# Patient Record
Sex: Male | Born: 1950
Health system: Southern US, Community
[De-identification: ages and names within clinical notes are randomized; demographics above are authoritative.]

## PROBLEM LIST (undated history)

## (undated) DIAGNOSIS — E039 Hypothyroidism, unspecified: Secondary | ICD-10-CM

## (undated) DIAGNOSIS — S32009A Unspecified fracture of unspecified lumbar vertebra, initial encounter for closed fracture: Secondary | ICD-10-CM

## (undated) DIAGNOSIS — K219 Gastro-esophageal reflux disease without esophagitis: Secondary | ICD-10-CM

## (undated) HISTORY — DX: Hypothyroidism, unspecified: E03.9

## (undated) HISTORY — DX: Unspecified fracture of unspecified lumbar vertebra, initial encounter for closed fracture: S32.009A

## (undated) HISTORY — PX: ADENOIDECTOMY: SUR15

## (undated) HISTORY — PX: TONSILLECTOMY: SUR1361

---

## 1998-01-10 ENCOUNTER — Inpatient Hospital Stay (HOSPITAL_COMMUNITY): Admission: AD | Admit: 1998-01-10 | Discharge: 1998-01-13 | Payer: Self-pay | Admitting: Neurosurgery

## 1998-01-11 ENCOUNTER — Encounter: Payer: Self-pay | Admitting: Neurosurgery

## 2000-01-25 ENCOUNTER — Ambulatory Visit (HOSPITAL_COMMUNITY): Admission: RE | Admit: 2000-01-25 | Discharge: 2000-01-25 | Payer: Self-pay | Admitting: Orthopedic Surgery

## 2000-01-30 ENCOUNTER — Encounter: Payer: Self-pay | Admitting: Orthopedic Surgery

## 2001-12-31 ENCOUNTER — Encounter: Payer: Self-pay | Admitting: Family Medicine

## 2001-12-31 ENCOUNTER — Ambulatory Visit (HOSPITAL_COMMUNITY): Admission: RE | Admit: 2001-12-31 | Discharge: 2001-12-31 | Payer: Self-pay | Admitting: Family Medicine

## 2002-01-21 ENCOUNTER — Ambulatory Visit (HOSPITAL_COMMUNITY): Admission: RE | Admit: 2002-01-21 | Discharge: 2002-01-21 | Payer: Self-pay | Admitting: General Surgery

## 2002-08-04 ENCOUNTER — Ambulatory Visit (HOSPITAL_COMMUNITY): Admission: RE | Admit: 2002-08-04 | Discharge: 2002-08-04 | Payer: Self-pay | Admitting: Family Medicine

## 2002-08-04 ENCOUNTER — Encounter: Payer: Self-pay | Admitting: Family Medicine

## 2002-08-09 ENCOUNTER — Encounter: Payer: Self-pay | Admitting: Family Medicine

## 2002-08-09 ENCOUNTER — Ambulatory Visit (HOSPITAL_COMMUNITY): Admission: RE | Admit: 2002-08-09 | Discharge: 2002-08-09 | Payer: Self-pay | Admitting: Family Medicine

## 2003-08-18 ENCOUNTER — Ambulatory Visit (HOSPITAL_COMMUNITY): Admission: RE | Admit: 2003-08-18 | Discharge: 2003-08-18 | Payer: Self-pay | Admitting: Family Medicine

## 2004-03-29 ENCOUNTER — Ambulatory Visit (HOSPITAL_COMMUNITY): Admission: RE | Admit: 2004-03-29 | Discharge: 2004-03-29 | Payer: Self-pay | Admitting: Family Medicine

## 2005-12-17 ENCOUNTER — Ambulatory Visit (HOSPITAL_COMMUNITY): Admission: RE | Admit: 2005-12-17 | Discharge: 2005-12-17 | Payer: Self-pay | Admitting: Family Medicine

## 2007-10-15 ENCOUNTER — Emergency Department (HOSPITAL_COMMUNITY): Admission: EM | Admit: 2007-10-15 | Discharge: 2007-10-15 | Payer: Self-pay | Admitting: Emergency Medicine

## 2008-02-25 ENCOUNTER — Emergency Department (HOSPITAL_COMMUNITY): Admission: EM | Admit: 2008-02-25 | Discharge: 2008-02-25 | Payer: Self-pay | Admitting: Emergency Medicine

## 2008-03-22 ENCOUNTER — Ambulatory Visit (HOSPITAL_COMMUNITY): Admission: RE | Admit: 2008-03-22 | Discharge: 2008-03-22 | Payer: Self-pay | Admitting: Family Medicine

## 2008-05-19 ENCOUNTER — Ambulatory Visit (HOSPITAL_COMMUNITY): Admission: RE | Admit: 2008-05-19 | Discharge: 2008-05-19 | Payer: Self-pay | Admitting: Family Medicine

## 2009-12-26 ENCOUNTER — Ambulatory Visit (HOSPITAL_COMMUNITY): Admission: RE | Admit: 2009-12-26 | Discharge: 2009-12-26 | Payer: Self-pay | Admitting: General Surgery

## 2010-05-29 ENCOUNTER — Other Ambulatory Visit (HOSPITAL_COMMUNITY): Payer: Self-pay | Admitting: Family Medicine

## 2010-05-29 ENCOUNTER — Ambulatory Visit (HOSPITAL_COMMUNITY)
Admission: RE | Admit: 2010-05-29 | Discharge: 2010-05-29 | Disposition: A | Discharge status: Home / Self Care | Payer: PRIVATE HEALTH INSURANCE | Source: Ambulatory Visit | Attending: Family Medicine | Admitting: Family Medicine

## 2010-05-29 DIAGNOSIS — M545 Low back pain, unspecified: Secondary | ICD-10-CM | POA: Insufficient documentation

## 2010-05-29 DIAGNOSIS — R52 Pain, unspecified: Secondary | ICD-10-CM

## 2010-05-29 DIAGNOSIS — W19XXXA Unspecified fall, initial encounter: Secondary | ICD-10-CM

## 2010-05-29 DIAGNOSIS — M533 Sacrococcygeal disorders, not elsewhere classified: Secondary | ICD-10-CM | POA: Insufficient documentation

## 2010-05-31 ENCOUNTER — Other Ambulatory Visit (HOSPITAL_COMMUNITY): Payer: Self-pay | Admitting: Family Medicine

## 2010-05-31 DIAGNOSIS — N5082 Scrotal pain: Secondary | ICD-10-CM

## 2010-06-04 ENCOUNTER — Other Ambulatory Visit (HOSPITAL_COMMUNITY): Payer: Self-pay | Admitting: Family Medicine

## 2010-06-04 ENCOUNTER — Ambulatory Visit (HOSPITAL_COMMUNITY)
Admission: RE | Admit: 2010-06-04 | Discharge: 2010-06-04 | Disposition: A | Discharge status: Home / Self Care | Payer: PRIVATE HEALTH INSURANCE | Source: Ambulatory Visit | Attending: Family Medicine | Admitting: Family Medicine

## 2010-06-04 DIAGNOSIS — N5082 Scrotal pain: Secondary | ICD-10-CM

## 2010-06-04 DIAGNOSIS — N509 Disorder of male genital organs, unspecified: Secondary | ICD-10-CM | POA: Insufficient documentation

## 2010-06-22 ENCOUNTER — Ambulatory Visit (INDEPENDENT_AMBULATORY_CARE_PROVIDER_SITE_OTHER): Payer: PRIVATE HEALTH INSURANCE | Admitting: Urology

## 2010-06-22 DIAGNOSIS — N509 Disorder of male genital organs, unspecified: Secondary | ICD-10-CM

## 2010-06-22 DIAGNOSIS — N400 Enlarged prostate without lower urinary tract symptoms: Secondary | ICD-10-CM

## 2010-07-27 ENCOUNTER — Ambulatory Visit (INDEPENDENT_AMBULATORY_CARE_PROVIDER_SITE_OTHER): Payer: PRIVATE HEALTH INSURANCE | Admitting: Urology

## 2010-07-27 DIAGNOSIS — R300 Dysuria: Secondary | ICD-10-CM

## 2010-07-27 DIAGNOSIS — N509 Disorder of male genital organs, unspecified: Secondary | ICD-10-CM

## 2010-07-27 DIAGNOSIS — R972 Elevated prostate specific antigen [PSA]: Secondary | ICD-10-CM

## 2010-10-19 ENCOUNTER — Other Ambulatory Visit (HOSPITAL_COMMUNITY): Payer: Self-pay | Admitting: Family Medicine

## 2010-10-19 ENCOUNTER — Ambulatory Visit (HOSPITAL_COMMUNITY)
Admission: RE | Admit: 2010-10-19 | Discharge: 2010-10-19 | Disposition: A | Discharge status: Home / Self Care | Payer: PRIVATE HEALTH INSURANCE | Source: Ambulatory Visit | Attending: Family Medicine | Admitting: Family Medicine

## 2010-10-19 DIAGNOSIS — R937 Abnormal findings on diagnostic imaging of other parts of musculoskeletal system: Secondary | ICD-10-CM | POA: Insufficient documentation

## 2010-10-19 DIAGNOSIS — M25579 Pain in unspecified ankle and joints of unspecified foot: Secondary | ICD-10-CM

## 2010-10-19 DIAGNOSIS — S8990XA Unspecified injury of unspecified lower leg, initial encounter: Secondary | ICD-10-CM | POA: Insufficient documentation

## 2010-10-19 DIAGNOSIS — M25473 Effusion, unspecified ankle: Secondary | ICD-10-CM | POA: Insufficient documentation

## 2010-10-19 DIAGNOSIS — S99929A Unspecified injury of unspecified foot, initial encounter: Secondary | ICD-10-CM | POA: Insufficient documentation

## 2010-10-19 DIAGNOSIS — M25476 Effusion, unspecified foot: Secondary | ICD-10-CM | POA: Insufficient documentation

## 2010-10-19 DIAGNOSIS — W11XXXA Fall on and from ladder, initial encounter: Secondary | ICD-10-CM | POA: Insufficient documentation

## 2010-12-28 LAB — POCT I-STAT, CHEM 8
Calcium, Ion: 1 mmol/L — ABNORMAL LOW (ref 1.12–1.32)
Creatinine, Ser: 1.3 mg/dL (ref 0.4–1.5)
Glucose, Bld: 119 mg/dL — ABNORMAL HIGH (ref 70–99)
HCT: 43 % (ref 39.0–52.0)
Hemoglobin: 14.6 g/dL (ref 13.0–17.0)

## 2012-09-15 NOTE — H&P (Signed)
  NTS SOAP Note  Vital Signs:  Vitals as of: 09/15/2012: Systolic 139: Diastolic 78: Heart Rate 69: Temp 97.61F: Height 52ft 0in: Weight 208Lbs 0 Ounces: BMI 28.21  BMI : 28.21 kg/m2  Subjective: This 1 Years 66 Months old Male presents for follow up TCS due to polyp removed in 2011 and recent worsening dysphagia.  Does have a h/o hiatal hernia.  Has been heving worsening reflux and dysphagia of solid food over the past six weeks.  No lower gi complaints.  Had to make himself throw up recently.   Review of Symptoms:  Constitutional:unremarkable   Head:unremarkable    Eyes:unremarkable   Nose/Mouth/Throat:unremarkable Cardiovascular:  unremarkable   Respiratory:unremarkable       as above Genitourinary:unremarkable     Skin:unremarkable Hematolgic/Lymphatic:unremarkable     Allergic/Immunologic:unremarkable     Past Medical History:    Reviewed   Past Medical History  Surgical History: TCS/EGD in 2011 Medical Problems: reflux Allergies: codeine Medications: prilosec   Social History:Reviewed  Social History  Preferred Language: English Race:  White Ethnicity: Not Hispanic / Latino Age: 62 Years 3 Months Marital Status:  M Alcohol: yes Recreational drug(s):  No   Smoking Status: Current every day smoker reviewed on 09/15/2012 Started Date: 03/25/1968 Packs per day: 1.00 Functional Status reviewed on mm/dd/yyyy ------------------------------------------------ Bathing: Normal Cooking: Normal Dressing: Normal Driving: Normal Eating: Normal Managing Meds: Normal Oral Care: Normal Shopping: Normal Toileting: Normal Transferring: Normal Walking: Normal Cognitive Status reviewed on mm/dd/yyyy ------------------------------------------------ Attention: Normal Decision Making: Normal Language: Normal Memory: Normal Motor: Normal Perception: Normal Problem Solving: Normal Visual and Spatial: Normal   Family History:   Reviewed  Family Health History Family History is Unknown    Objective Information: General:  Well appearing, well nourished in no distress. Neck:  Supple without lymphadenopathy.  Heart:  RRR, no murmur Lungs:    CTA bilaterally, no wheezes, rhonchi, rales.  Breathing unlabored. Abdomen:Soft, NT/ND, no HSM, no masses.   deferred to procedure  Assessment:h/o colon polyp, dysphagia, hiatal hernia  Diagnosis &amp; Procedure Smart Code   Plan:Scheduled for TCS, EGD, possible dilatation on 09/22/12.   Patient Education:Alternative treatments to surgery were discussed with patient (and family).  Risks and benefits  of procedure including bleeding and perforation were fully explained to the patient (and family) who gave informed consent. Patient/family questions were addressed.  Follow-up:Pending Surgery

## 2012-09-16 ENCOUNTER — Encounter (HOSPITAL_COMMUNITY): Payer: Self-pay | Admitting: Pharmacy Technician

## 2012-09-22 ENCOUNTER — Encounter (HOSPITAL_COMMUNITY): Payer: Self-pay | Admitting: *Deleted

## 2012-09-22 ENCOUNTER — Ambulatory Visit (HOSPITAL_COMMUNITY)
Admission: RE | Admit: 2012-09-22 | Discharge: 2012-09-22 | Disposition: A | Discharge status: Home / Self Care | Payer: PRIVATE HEALTH INSURANCE | Source: Ambulatory Visit | Attending: General Surgery | Admitting: General Surgery

## 2012-09-22 ENCOUNTER — Encounter (HOSPITAL_COMMUNITY)
Admission: RE | Disposition: A | Discharge status: Home / Self Care | Payer: Self-pay | Source: Ambulatory Visit | Attending: General Surgery

## 2012-09-22 DIAGNOSIS — K449 Diaphragmatic hernia without obstruction or gangrene: Secondary | ICD-10-CM | POA: Insufficient documentation

## 2012-09-22 DIAGNOSIS — Z79899 Other long term (current) drug therapy: Secondary | ICD-10-CM | POA: Insufficient documentation

## 2012-09-22 DIAGNOSIS — Q393 Congenital stenosis and stricture of esophagus: Secondary | ICD-10-CM | POA: Insufficient documentation

## 2012-09-22 DIAGNOSIS — F17200 Nicotine dependence, unspecified, uncomplicated: Secondary | ICD-10-CM | POA: Insufficient documentation

## 2012-09-22 DIAGNOSIS — Z1211 Encounter for screening for malignant neoplasm of colon: Secondary | ICD-10-CM | POA: Insufficient documentation

## 2012-09-22 DIAGNOSIS — K219 Gastro-esophageal reflux disease without esophagitis: Secondary | ICD-10-CM | POA: Insufficient documentation

## 2012-09-22 DIAGNOSIS — Z860100 Personal history of colon polyps, unspecified: Secondary | ICD-10-CM | POA: Insufficient documentation

## 2012-09-22 DIAGNOSIS — Z885 Allergy status to narcotic agent status: Secondary | ICD-10-CM | POA: Insufficient documentation

## 2012-09-22 DIAGNOSIS — K319 Disease of stomach and duodenum, unspecified: Secondary | ICD-10-CM | POA: Insufficient documentation

## 2012-09-22 DIAGNOSIS — Q391 Atresia of esophagus with tracheo-esophageal fistula: Secondary | ICD-10-CM | POA: Insufficient documentation

## 2012-09-22 HISTORY — DX: Gastro-esophageal reflux disease without esophagitis: K21.9

## 2012-09-22 HISTORY — PX: COLONOSCOPY: SHX5424

## 2012-09-22 HISTORY — PX: ESOPHAGOGASTRODUODENOSCOPY (EGD) WITH ESOPHAGEAL DILATION: SHX5812

## 2012-09-22 SURGERY — COLONOSCOPY
Anesthesia: Moderate Sedation

## 2012-09-22 MED ORDER — MEPERIDINE HCL 25 MG/ML IJ SOLN
INTRAMUSCULAR | Status: DC | PRN
  Administered 2012-09-22: 50 mg via INTRAVENOUS

## 2012-09-22 MED ORDER — MIDAZOLAM HCL 5 MG/5ML IJ SOLN
INTRAMUSCULAR | Status: DC | PRN
  Administered 2012-09-22: 1 mg via INTRAVENOUS
  Administered 2012-09-22: 4 mg via INTRAVENOUS

## 2012-09-22 MED ORDER — SODIUM CHLORIDE 0.9 % IV SOLN
INTRAVENOUS | Status: DC
  Administered 2012-09-22: 09:00:00 via INTRAVENOUS

## 2012-09-22 MED ORDER — BUTAMBEN-TETRACAINE-BENZOCAINE 2-2-14 % EX AERO
INHALATION_SPRAY | CUTANEOUS | Status: DC | PRN
  Administered 2012-09-22: 2 via TOPICAL

## 2012-09-22 MED ORDER — OMEPRAZOLE 40 MG PO CPDR: 40.0000 mg | DELAYED_RELEASE_CAPSULE | Freq: Every day | ORAL | Status: DC

## 2012-09-22 MED ORDER — MEPERIDINE HCL 100 MG/ML IJ SOLN
INTRAMUSCULAR | Status: AC
  Filled 2012-09-22: qty 1

## 2012-09-22 MED ORDER — MIDAZOLAM HCL 5 MG/5ML IJ SOLN
INTRAMUSCULAR | Status: AC
  Filled 2012-09-22: qty 10

## 2012-09-22 NOTE — Op Note (Signed)
[facility] [address] [place], [address]   ENDOSCOPY PROCEDURE REPORT  PATIENT: Lucas Leach, Lucas Leach  MR#: [id number] BIRTHDATE: 05-18-1950 , 62  yrs. old GENDER: Male ENDOSCOPIST: Franky Macho, MD REFERRED BY:  Assunta Found PROCEDURE DATE:  09/22/2012 PROCEDURE:  EGD w/ biopsy for H.pylori and Maloney dilation of esophagus ASA CLASS:     Class II INDICATIONS:  Dysphagia. MEDICATIONS: Versed 5 mg IV and Demerol 50 mg IV TOPICAL ANESTHETIC: Cetacaine Spray  DESCRIPTION OF PROCEDURE: After the risks benefits and alternatives of the procedure were thoroughly explained, informed consent was obtained.  The EC-3890Li ([id number]) and [id number] ([id number]) endoscope was introduced through the mouth and advanced to the second portion of the duodenum. Without limitations.  The instrument was slowly withdrawn as the mucosa was fully examined.        DUODENUM: The duodenal mucosa showed no abnormalities in the entire duodenum.  STOMACH: There was mild antral gastropathy noted.  Cold forcep biopsies were taken at the antrum.   A medium sized hiatal hernia was noted.  ESOPHAGUS: A mildly severe Schatzki ring was found at the gastroesophageal junction.  Maloney dilatation was done gradually starting with 52, then 54, 56, ending with 58.  No blood noted at end of dilatation.  Inspected with endoscopy and no evidence of perforation noted.  Retroflexed views revealed a hiatal hernia.     The scope was then withdrawn from the patient and the procedure completed.  COMPLICATIONS: There were no complications. ENDOSCOPIC IMPRESSION: 1.   The duodenal mucosa showed no abnormalities in the entire duodenum 2.   There was mild antral gastropathy noted [T2] 3.   Medium sized hiatal hernia 4.   Schatzki ring was found at the gastroesophageal junction  RECOMMENDATIONS: 1.  Continue PPI 2.  Dilatations PRN  REPEAT EXAM:  eSigned:  Franky Macho, MD 09/22/2012 10:07  AM   CC:

## 2012-09-22 NOTE — Interval H&P Note (Signed)
History and Physical Interval Note:  09/22/2012 9:24 AM  Lucas Leach  has presented today for surgery, with the diagnosis of colon polyp, dysphagia  The various methods of treatment have been discussed with the patient and family. After consideration of risks, benefits and other options for treatment, the patient has consented to  Procedure(s): COLONOSCOPY (N/A) ESOPHAGOGASTRODUODENOSCOPY (EGD) WITH POSSIBLE ESOPHAGEAL DILATION (N/A) as a surgical intervention .  The patient's history has been reviewed, patient examined, no change in status, stable for surgery.  I have reviewed the patient's chart and labs.  Questions were answered to the patient's satisfaction.     Franky Macho A

## 2012-09-22 NOTE — Op Note (Signed)
[facility] [address] [place], [address]   COLONOSCOPY PROCEDURE REPORT  PATIENT: Lucas Leach, Lucas Leach  MR#: [id number] BIRTHDATE: Sep 17, 1950 , 62  yrs. old GENDER: Male ENDOSCOPIST: Franky Macho, MD REFERRED XB:[id number], John PROCEDURE DATE:  09/22/2012 PROCEDURE:   Colonoscopy, surveillance ASA CLASS:   Class II INDICATIONS:Patient's personal history of adenomatous colon polyps.  MEDICATIONS: Versed 5 mg IV and Demerol 50 mg IV  DESCRIPTION OF PROCEDURE:   After the risks benefits and alternatives of the procedure were thoroughly explained, informed consent was obtained.  A digital rectal exam revealed no abnormalities of the rectum.   The EC-3890Li ([id number])  endoscope was introduced through the anus and advanced to the cecum, which was identified by both the appendix and ileocecal valve. No adverse events experienced.   The quality of the prep was adequate, using MoviPrep  The instrument was then slowly withdrawn as the colon was fully examined.      COLON FINDINGS: A normal appearing cecum, ileocecal valve, and appendiceal orifice were identified.  The ascending, hepatic flexure, transverse, splenic flexure, descending, sigmoid colon and rectum appeared unremarkable.  No polyps or cancers were seen. Retroflexed views revealed no abnormalities. The time to cecum=3 minutes 0 seconds.  Withdrawal time=4 minutes 0 seconds.  The scope was withdrawn and the procedure completed. COMPLICATIONS: There were no complications.  ENDOSCOPIC IMPRESSION: Normal colon  RECOMMENDATIONS: Repeat Colonscopy in 10 years.   eSigned:  Franky Macho, MD 09/22/2012 9:59 AM   cc:

## 2012-09-24 ENCOUNTER — Encounter (HOSPITAL_COMMUNITY): Payer: Self-pay | Admitting: General Surgery

## 2014-06-09 ENCOUNTER — Other Ambulatory Visit (HOSPITAL_COMMUNITY): Payer: Self-pay | Admitting: Family Medicine

## 2014-06-09 DIAGNOSIS — Z139 Encounter for screening, unspecified: Secondary | ICD-10-CM

## 2014-06-14 ENCOUNTER — Ambulatory Visit (HOSPITAL_COMMUNITY): Payer: PRIVATE HEALTH INSURANCE

## 2014-06-17 ENCOUNTER — Ambulatory Visit (HOSPITAL_COMMUNITY): Payer: PRIVATE HEALTH INSURANCE

## 2015-04-07 DIAGNOSIS — Z23 Encounter for immunization: Secondary | ICD-10-CM | POA: Diagnosis not present

## 2015-04-07 DIAGNOSIS — J069 Acute upper respiratory infection, unspecified: Secondary | ICD-10-CM | POA: Diagnosis not present

## 2015-04-07 DIAGNOSIS — Z1389 Encounter for screening for other disorder: Secondary | ICD-10-CM | POA: Diagnosis not present

## 2015-04-07 DIAGNOSIS — Z6830 Body mass index (BMI) 30.0-30.9, adult: Secondary | ICD-10-CM | POA: Diagnosis not present

## 2015-06-21 DIAGNOSIS — R195 Other fecal abnormalities: Secondary | ICD-10-CM | POA: Diagnosis not present

## 2015-06-21 DIAGNOSIS — Z6829 Body mass index (BMI) 29.0-29.9, adult: Secondary | ICD-10-CM | POA: Diagnosis not present

## 2015-06-21 DIAGNOSIS — R7309 Other abnormal glucose: Secondary | ICD-10-CM | POA: Diagnosis not present

## 2015-06-21 DIAGNOSIS — Z1389 Encounter for screening for other disorder: Secondary | ICD-10-CM | POA: Diagnosis not present

## 2015-06-21 DIAGNOSIS — R1013 Epigastric pain: Secondary | ICD-10-CM | POA: Diagnosis not present

## 2015-06-21 DIAGNOSIS — E663 Overweight: Secondary | ICD-10-CM | POA: Diagnosis not present

## 2015-07-10 DIAGNOSIS — K760 Fatty (change of) liver, not elsewhere classified: Secondary | ICD-10-CM | POA: Diagnosis not present

## 2015-07-10 DIAGNOSIS — N400 Enlarged prostate without lower urinary tract symptoms: Secondary | ICD-10-CM | POA: Diagnosis not present

## 2015-07-12 DIAGNOSIS — N400 Enlarged prostate without lower urinary tract symptoms: Secondary | ICD-10-CM | POA: Diagnosis not present

## 2015-09-05 DIAGNOSIS — K449 Diaphragmatic hernia without obstruction or gangrene: Secondary | ICD-10-CM | POA: Diagnosis not present

## 2015-09-20 DIAGNOSIS — Z0000 Encounter for general adult medical examination without abnormal findings: Secondary | ICD-10-CM | POA: Diagnosis not present

## 2015-09-20 DIAGNOSIS — Z6829 Body mass index (BMI) 29.0-29.9, adult: Secondary | ICD-10-CM | POA: Diagnosis not present

## 2015-09-20 DIAGNOSIS — Z1389 Encounter for screening for other disorder: Secondary | ICD-10-CM | POA: Diagnosis not present

## 2015-09-20 DIAGNOSIS — E782 Mixed hyperlipidemia: Secondary | ICD-10-CM | POA: Diagnosis not present

## 2015-09-20 DIAGNOSIS — R7309 Other abnormal glucose: Secondary | ICD-10-CM | POA: Diagnosis not present

## 2015-11-22 ENCOUNTER — Ambulatory Visit (INDEPENDENT_AMBULATORY_CARE_PROVIDER_SITE_OTHER): Payer: PPO | Admitting: Urology

## 2015-11-22 DIAGNOSIS — R972 Elevated prostate specific antigen [PSA]: Secondary | ICD-10-CM | POA: Diagnosis not present

## 2015-11-22 DIAGNOSIS — R351 Nocturia: Secondary | ICD-10-CM

## 2015-11-22 DIAGNOSIS — N401 Enlarged prostate with lower urinary tract symptoms: Secondary | ICD-10-CM

## 2016-02-19 DIAGNOSIS — R972 Elevated prostate specific antigen [PSA]: Secondary | ICD-10-CM | POA: Diagnosis not present

## 2016-02-19 DIAGNOSIS — Z23 Encounter for immunization: Secondary | ICD-10-CM | POA: Diagnosis not present

## 2016-02-28 ENCOUNTER — Ambulatory Visit (INDEPENDENT_AMBULATORY_CARE_PROVIDER_SITE_OTHER): Payer: PPO | Admitting: Urology

## 2016-02-28 DIAGNOSIS — N401 Enlarged prostate with lower urinary tract symptoms: Secondary | ICD-10-CM

## 2016-02-28 DIAGNOSIS — R972 Elevated prostate specific antigen [PSA]: Secondary | ICD-10-CM | POA: Diagnosis not present

## 2016-03-25 HISTORY — PX: THUMB AMPUTATION: SHX804

## 2016-07-10 ENCOUNTER — Ambulatory Visit (HOSPITAL_COMMUNITY)
Admission: RE | Admit: 2016-07-10 | Discharge: 2016-07-10 | Disposition: A | Discharge status: Home / Self Care | Payer: PPO | Source: Ambulatory Visit | Attending: Family Medicine | Admitting: Family Medicine

## 2016-07-10 ENCOUNTER — Other Ambulatory Visit (HOSPITAL_COMMUNITY): Payer: Self-pay | Admitting: Family Medicine

## 2016-07-10 DIAGNOSIS — Z6829 Body mass index (BMI) 29.0-29.9, adult: Secondary | ICD-10-CM | POA: Diagnosis not present

## 2016-07-10 DIAGNOSIS — R0602 Shortness of breath: Secondary | ICD-10-CM | POA: Diagnosis not present

## 2016-07-10 DIAGNOSIS — R002 Palpitations: Secondary | ICD-10-CM | POA: Diagnosis not present

## 2016-07-10 DIAGNOSIS — Z1389 Encounter for screening for other disorder: Secondary | ICD-10-CM | POA: Diagnosis not present

## 2016-07-10 DIAGNOSIS — R0789 Other chest pain: Secondary | ICD-10-CM | POA: Diagnosis not present

## 2016-07-10 DIAGNOSIS — E782 Mixed hyperlipidemia: Secondary | ICD-10-CM | POA: Diagnosis not present

## 2016-07-10 DIAGNOSIS — R7309 Other abnormal glucose: Secondary | ICD-10-CM | POA: Diagnosis not present

## 2016-07-31 ENCOUNTER — Encounter: Payer: Self-pay | Admitting: Cardiology

## 2016-07-31 NOTE — Progress Notes (Signed)
Cardiology Office Note  Date: 08/01/2016   ID: Gardiner CoinsCalvin M Selvage, DOB 12/09/1950, MRN [id number]  PCP: Assunta FoundGolding, John, MD  Consulting Cardiologist: Nona DellSamuel Chrystel Barefield, MD   Chief Complaint  Patient presents with  . Chest discomfort  . Dyspnea on exertion    History of Present Illness: Lucas Leach is a 66 y.o. male referred for cardiology consultation by Dr. Phillips OdorGolding for the assessment of shortness of breath and chest pain. Patient works as a Music therapistcarpenter, states that over the last 6 months or so he has felt more short of breath with activities such as carrying lumber, bending over to pick something up, walking up steps. He does not report chest tightness with activity, but at nighttime when he is still, he occasionally feels pressure in his chest and a sense of forceful heartbeat. This does not occur with any regularity. He does not report any dizziness or syncope.  He tells me that he underwent cardiac testing including a stress test approximately 8-10 years ago, does not recall any abnormalities. Cardiac risk factors include gender and age, prior history of tobacco use. I personally reviewed his ECG today which shows sinus rhythm with leftward axis, nonspecific T-wave changes, and decreased R wave progression. I am not certain about his lipid status, he follows with Dr. Phillips OdorGolding, no recent labwork available for review. Patient tells me that his mother died recently in her 380s with heart failure, uncertain about any definite premature CAD in the family.  Past Medical History:  Diagnosis Date  . GERD (gastroesophageal reflux disease)   . Hypothyroidism   . Lumbar vertebral fracture (HCC)    L3    Past Surgical History:  Procedure Laterality Date  . ADENOIDECTOMY    . COLONOSCOPY N/A 09/22/2012   Procedure: COLONOSCOPY;  Surgeon: Dalia HeadingMark A Jenkins, MD;  [facility];  Service: Gastroenterology;  Laterality: N/A;  . ESOPHAGOGASTRODUODENOSCOPY (EGD) WITH ESOPHAGEAL DILATION N/A 09/22/2012    Procedure: ESOPHAGOGASTRODUODENOSCOPY (EGD) WITH POSSIBLE ESOPHAGEAL DILATION;  Surgeon: Dalia HeadingMark A Jenkins, MD;  [facility]: [facility];  Service: Gastroenterology;  Laterality: N/A;  . TONSILLECTOMY      Current Outpatient Prescriptions  Medication Sig Dispense Refill  . aspirin EC 81 MG tablet Take 81 mg by mouth daily.    Marland Kitchen. levothyroxine (SYNTHROID, LEVOTHROID) 50 MCG tablet Take 50 mcg by mouth daily before breakfast.     . Multiple Vitamin (MULTIVITAMIN WITH MINERALS) TABS Take 1 tablet by mouth every other day.    Marland Kitchen. omeprazole (PRILOSEC) 40 MG capsule Take 1 capsule (40 mg total) by mouth daily. 90 capsule 3   No current facility-administered medications for this visit.    Allergies:  Codeine and Latex   Social History: The patient  reports that he has quit smoking. His smoking use included Cigarettes. He has never used smokeless tobacco. He reports that he drinks alcohol. He reports that he does not use drugs.   Family History: The patient's family history includes Cerebral aneurysm in his father; Diabetes Mellitus II in his mother.   ROS:  Please see the history of present illness. Otherwise, complete review of systems is positive for lower back pain.  All other systems are reviewed and negative.   Physical Exam: VS:  BP 138/68 (BP Location: Right Arm)   Pulse 63   Ht 6' (1.829 m)   Wt 211 lb (95.7 kg)   SpO2 96%   BMI 28.62 kg/m , BMI Body mass index is 28.62 kg/m.  Wt Readings  from Last 3 Encounters:  08/01/16 211 lb (95.7 kg)  09/22/12 208 lb (94.3 kg)    General: Overweight male, appears comfortable at rest. HEENT: Conjunctiva and lids normal, oropharynx clear. Neck: Supple, no elevated JVP or carotid bruits, no thyromegaly. Lungs: Clear to auscultation, no wheezing, nonlabored breathing at rest. Cardiac: Regular rate and rhythm, no S3 or significant systolic murmur, no pericardial rub. Abdomen: Soft, nontender, bowel sounds present, no guarding or  rebound. Extremities: No pitting edema, distal pulses 2+. Skin: Warm and dry. Musculoskeletal: No kyphosis. Neuropsychiatric: Alert and oriented x3, affect grossly appropriate.  ECG: No old tracing available for comparison.  Assessment and Plan:  1. Progressive dyspnea on exertion with intermittent chest tightness as outlined above. Patient has noticed this over the last 6 months. Cardiac risk factors include gender and age, previous tobacco use. I am not certain about his lipid status. His elderly mother died with heart failure, not clear that there is any premature CAD in his family. He has not undergone any recent ischemic testing, baseline ECG is abnormal but relatively nonspecific. States that he would have significant difficulty walking on the treadmill, does have chronic back pain. We will obtain a Lexiscan Myoview for ischemic evaluation with echocardiogram to assess cardiac structure and function.  2. Hypothyroidism, on Synthroid.  3. Tobacco abuse in remission.  4. GERD, on Prilosec.  Current medicines were reviewed with the patient today.   Orders Placed This Encounter  Procedures  . NM Myocar Multi W/Spect W/Wall Motion / EF  . Myocardial Perfusion Imaging  . EKG 12-Lead  . ECHOCARDIOGRAM COMPLETE    Disposition: Call with test results.  Signed, Jonelle Sidle, MD, [facility] 08/01/2016 10:43 AM    [facility] [address] [place], [place] [address] Phone: [phone]; Fax: [phone]

## 2016-08-01 ENCOUNTER — Ambulatory Visit (INDEPENDENT_AMBULATORY_CARE_PROVIDER_SITE_OTHER): Payer: PPO | Admitting: Cardiology

## 2016-08-01 ENCOUNTER — Encounter: Payer: Self-pay | Admitting: Cardiology

## 2016-08-01 VITALS — BP 138/68 | HR 63 | Ht 72.0 in | Wt 211.0 lb

## 2016-08-01 DIAGNOSIS — K219 Gastro-esophageal reflux disease without esophagitis: Secondary | ICD-10-CM | POA: Diagnosis not present

## 2016-08-01 DIAGNOSIS — R072 Precordial pain: Secondary | ICD-10-CM

## 2016-08-01 DIAGNOSIS — R0609 Other forms of dyspnea: Secondary | ICD-10-CM | POA: Diagnosis not present

## 2016-08-01 DIAGNOSIS — E039 Hypothyroidism, unspecified: Secondary | ICD-10-CM

## 2016-08-01 DIAGNOSIS — R9431 Abnormal electrocardiogram [ECG] [EKG]: Secondary | ICD-10-CM

## 2016-08-01 DIAGNOSIS — F17201 Nicotine dependence, unspecified, in remission: Secondary | ICD-10-CM

## 2016-08-01 NOTE — Patient Instructions (Signed)
Medication Instructions:  Your physician recommends that you continue on your current medications as directed. Please refer to the Current Medication list given to you today.  Labwork: NONE  Testing/Procedures: Your physician has requested that you have an echocardiogram. Echocardiography is a painless test that uses sound waves to create images of your heart. It provides your doctor with information about the size and shape of your heart and how well your heart's chambers and valves are working. This procedure takes approximately one hour. There are no restrictions for this procedure.  Your physician has requested that you have a lexiscan myoview. For further information please visit [email or web address]. Please follow instruction sheet, as given.  Follow-Up: Your physician recommends that you schedule a follow-up appointment PENDING TEST RESULTS  Any Other Special Instructions Will Be Listed Below (If Applicable).  If you need a refill on your cardiac medications before your next appointment, please call your pharmacy. 

## 2016-08-08 ENCOUNTER — Ambulatory Visit (HOSPITAL_BASED_OUTPATIENT_CLINIC_OR_DEPARTMENT_OTHER)
Admission: RE | Admit: 2016-08-08 | Discharge: 2016-08-08 | Disposition: A | Discharge status: Home / Self Care | Payer: PPO | Source: Ambulatory Visit | Attending: Cardiology | Admitting: Cardiology

## 2016-08-08 ENCOUNTER — Encounter (HOSPITAL_COMMUNITY)
Admission: RE | Admit: 2016-08-08 | Discharge: 2016-08-08 | Disposition: A | Discharge status: Home / Self Care | Payer: PPO | Source: Ambulatory Visit | Attending: Cardiology | Admitting: Cardiology

## 2016-08-08 ENCOUNTER — Encounter (HOSPITAL_BASED_OUTPATIENT_CLINIC_OR_DEPARTMENT_OTHER)
Admission: RE | Admit: 2016-08-08 | Discharge: 2016-08-08 | Disposition: A | Discharge status: Home / Self Care | Payer: PPO | Source: Ambulatory Visit | Attending: Cardiology | Admitting: Cardiology

## 2016-08-08 ENCOUNTER — Encounter (HOSPITAL_COMMUNITY): Payer: Self-pay

## 2016-08-08 DIAGNOSIS — R0609 Other forms of dyspnea: Secondary | ICD-10-CM

## 2016-08-08 DIAGNOSIS — K219 Gastro-esophageal reflux disease without esophagitis: Secondary | ICD-10-CM

## 2016-08-08 DIAGNOSIS — Z87891 Personal history of nicotine dependence: Secondary | ICD-10-CM | POA: Insufficient documentation

## 2016-08-08 DIAGNOSIS — E039 Hypothyroidism, unspecified: Secondary | ICD-10-CM

## 2016-08-08 DIAGNOSIS — I209 Angina pectoris, unspecified: Secondary | ICD-10-CM | POA: Insufficient documentation

## 2016-08-08 LAB — NM MYOCAR MULTI W/SPECT W/WALL MOTION / EF
CHL CUP RESTING HR STRESS: 61 {beats}/min
LVDIAVOL: 110 mL (ref 62–150)
LVSYSVOL: 32 mL
NUC STRESS TID: 0.95
Peak HR: 82 {beats}/min
RATE: 0.37
SDS: 1
SRS: 1
SSS: 2

## 2016-08-08 MED ORDER — REGADENOSON 0.4 MG/5ML IV SOLN
INTRAVENOUS | Status: AC
  Administered 2016-08-08: 0.4 mg via INTRAVENOUS
  Filled 2016-08-08: qty 5

## 2016-08-08 MED ORDER — TECHNETIUM TC 99M TETROFOSMIN IV KIT
30.0000 | PACK | Freq: Once | INTRAVENOUS | Status: AC | PRN
  Administered 2016-08-08: 30.5 via INTRAVENOUS

## 2016-08-08 MED ORDER — TECHNETIUM TC 99M TETROFOSMIN IV KIT
10.0000 | PACK | Freq: Once | INTRAVENOUS | Status: AC | PRN
  Administered 2016-08-08: 11 via INTRAVENOUS

## 2016-08-08 MED ORDER — SODIUM CHLORIDE 0.9% FLUSH
INTRAVENOUS | Status: AC
Start: 2016-08-08 — End: 2016-08-08
  Administered 2016-08-08: 10 mL via INTRAVENOUS
  Filled 2016-08-08: qty 10

## 2016-08-08 NOTE — Progress Notes (Signed)
*  PRELIMINARY RESULTS* Echocardiogram 2D Echocardiogram has been performed.  Jeryl Columbialliott, [place] Filice 08/08/2016, 10:11 AM

## 2016-08-12 ENCOUNTER — Telehealth: Payer: Self-pay | Admitting: Cardiology

## 2016-08-12 NOTE — Telephone Encounter (Signed)
Patient states that he had stress test on Friday and started to have sore throat on Saturday. Wanted to know if this could be related. / tg

## 2016-08-12 NOTE — Telephone Encounter (Signed)
Called patient and he states that he was stated by PCP on Synthroid. Pt states he will call PCP.

## 2016-08-15 DIAGNOSIS — E039 Hypothyroidism, unspecified: Secondary | ICD-10-CM | POA: Diagnosis not present

## 2016-08-22 DIAGNOSIS — E039 Hypothyroidism, unspecified: Secondary | ICD-10-CM | POA: Diagnosis not present

## 2016-08-22 DIAGNOSIS — J069 Acute upper respiratory infection, unspecified: Secondary | ICD-10-CM | POA: Diagnosis not present

## 2016-08-22 DIAGNOSIS — Z6830 Body mass index (BMI) 30.0-30.9, adult: Secondary | ICD-10-CM | POA: Diagnosis not present

## 2016-08-22 DIAGNOSIS — R972 Elevated prostate specific antigen [PSA]: Secondary | ICD-10-CM | POA: Diagnosis not present

## 2016-08-28 ENCOUNTER — Ambulatory Visit (INDEPENDENT_AMBULATORY_CARE_PROVIDER_SITE_OTHER): Payer: PPO | Admitting: Urology

## 2016-08-28 DIAGNOSIS — N401 Enlarged prostate with lower urinary tract symptoms: Secondary | ICD-10-CM

## 2016-08-28 DIAGNOSIS — R972 Elevated prostate specific antigen [PSA]: Secondary | ICD-10-CM | POA: Diagnosis not present

## 2016-09-10 ENCOUNTER — Ambulatory Visit (INDEPENDENT_AMBULATORY_CARE_PROVIDER_SITE_OTHER): Payer: PPO | Admitting: General Surgery

## 2016-09-10 ENCOUNTER — Ambulatory Visit: Payer: PPO | Admitting: General Surgery

## 2016-09-10 ENCOUNTER — Encounter: Payer: Self-pay | Admitting: General Surgery

## 2016-09-10 VITALS — BP 156/89 | HR 69 | Temp 97.8°F | Resp 18 | Ht 72.0 in | Wt 214.0 lb

## 2016-09-10 DIAGNOSIS — K644 Residual hemorrhoidal skin tags: Secondary | ICD-10-CM | POA: Diagnosis not present

## 2016-09-10 DIAGNOSIS — H11002 Unspecified pterygium of left eye: Secondary | ICD-10-CM | POA: Diagnosis not present

## 2016-09-10 MED ORDER — HYDROCORTISONE 2.5 % RE CREA: 1.0000 "application " | TOPICAL_CREAM | Freq: Two times a day (BID) | RECTAL | 1 refills | Status: DC

## 2016-09-10 NOTE — Progress Notes (Signed)
Lucas Leach; [id number]; 18-Jun-1950   HPI Patient is a 66 year old white male who was referred to my care by Dr. Phillips Odor for evaluation treatment of hemorrhoidal disease. The patient states he has never had significant hemorrhoidal issues in the past, but over the last 4 weeks, he has developed rectal pain. He did have one episode of blood per rectum when he wiped himself. He states he was constipated at that time. He states his pain is 8 out of 10. He last had a colonoscopy in 2014. He has tried over the counter creams without success. He denies any current diarrhea, constipation, or blood per rectum. Past Medical History:  Diagnosis Date  . GERD (gastroesophageal reflux disease)   . Hypothyroidism   . Lumbar vertebral fracture (HCC)    L3    Past Surgical History:  Procedure Laterality Date  . ADENOIDECTOMY    . COLONOSCOPY N/A 09/22/2012   Procedure: COLONOSCOPY;  Surgeon: Dalia Heading, MD;  [facility]: [facility];  Service: Gastroenterology;  Laterality: N/A;  . ESOPHAGOGASTRODUODENOSCOPY (EGD) WITH ESOPHAGEAL DILATION N/A 09/22/2012   Procedure: ESOPHAGOGASTRODUODENOSCOPY (EGD) WITH POSSIBLE ESOPHAGEAL DILATION;  Surgeon: Dalia Heading, MD;  [facility]: [facility];  Service: Gastroenterology;  Laterality: N/A;  . TONSILLECTOMY      Family History  Problem Relation Age of Onset  . Diabetes Mellitus II Mother   . Cerebral aneurysm Father     Current Outpatient Prescriptions on File Prior to Visit  Medication Sig Dispense Refill  . aspirin EC 81 MG tablet Take 81 mg by mouth daily.    Marland Kitchen levothyroxine (SYNTHROID, LEVOTHROID) 50 MCG tablet Take 50 mcg by mouth daily before breakfast.     . Multiple Vitamin (MULTIVITAMIN WITH MINERALS) TABS Take 1 tablet by mouth every other day.    Marland Kitchen omeprazole (PRILOSEC) 40 MG capsule Take 1 capsule (40 mg total) by mouth daily. 90 capsule 3   No current facility-administered medications on file prior to visit.     Allergies  Allergen  Reactions  . Codeine Other (See Comments)    Not in right state of mind.   . Latex Rash    History  Alcohol Use  . Yes    Comment: 1-2 beers per day    History  Smoking Status  . Former Smoker  . Types: Cigarettes  Smokeless Tobacco  . Never Used    Review of Systems  Constitutional: Negative.   HENT: Negative.   Eyes: Positive for blurred vision.  Respiratory: Positive for shortness of breath.   Cardiovascular: Negative.   Gastrointestinal: Positive for heartburn.  Genitourinary: Negative.   Musculoskeletal: Negative.   Skin: Negative.   Neurological: Positive for headaches.  Endo/Heme/Allergies: Negative.   Psychiatric/Behavioral: Negative.     Objective   Vitals:   09/10/16 1039  BP: (!) 156/89  Pulse: 69  Resp: 18  Temp: 97.8 F (36.6 C)    Physical Exam  Constitutional: He is oriented to person, place, and time and well-developed, well-nourished, and in no distress.  HENT:  Head: Normocephalic and atraumatic.  Neck: Normal range of motion. Neck supple.  Cardiovascular: Normal rate, regular rhythm and normal heart sounds.   No murmur heard. Pulmonary/Chest: Effort normal and breath sounds normal. He has no wheezes. He has no rales.  Abdominal: Soft. Bowel sounds are normal. He exhibits no distension. There is no tenderness.  Genitourinary:  Genitourinary Comments: Rectal examination reveals a tight sphincter tone with no bleeding noted. No fissure could be appreciated.  Patient has small external hemorrhoidal tags present. No prolapsing internal hemorrhoid noted.  Neurological: He is alert and oriented to person, place, and time.  Vitals reviewed.   Assessment  External hemorrhoidal skin tags. Patient may have had an internal hemorrhoid in the past, but this has resolved. Hemoccult anal fissure could also be present, though I could not appreciated on physical exam. There is no need for acute surgical intervention at this time. Plan   Anusol HC cream  has been prescribed. He should apply twice a day to the rectum for the next 2 weeks. I did give him refill. He was instructed to follow-up in my care in 1-2 months should his symptoms not resolve. All questions were answered.

## 2016-09-10 NOTE — Patient Instructions (Signed)
Hemorrhoids Hemorrhoids are swollen veins in and around the rectum or anus. There are two types of hemorrhoids:  Internal hemorrhoids. These occur in the veins that are just inside the rectum. They may poke through to the outside and become irritated and painful.  External hemorrhoids. These occur in the veins that are outside of the anus and can be felt as a painful swelling or hard lump near the anus.  Most hemorrhoids do not cause serious problems, and they can be managed with home treatments such as diet and lifestyle changes. If home treatments do not help your symptoms, procedures can be done to shrink or remove the hemorrhoids. What are the causes? This condition is caused by increased pressure in the anal area. This pressure may result from various things, including:  Constipation.  Straining to have a bowel movement.  Diarrhea.  Pregnancy.  Obesity.  Sitting for long periods of time.  Heavy lifting or other activity that causes you to strain.  Anal sex.  What are the signs or symptoms? Symptoms of this condition include:  Pain.  Anal itching or irritation.  Rectal bleeding.  Leakage of stool (feces).  Anal swelling.  One or more lumps around the anus.  How is this diagnosed? This condition can often be diagnosed through a visual exam. Other exams or tests may also be done, such as:  Examination of the rectal area with a gloved hand (digital rectal exam).  Examination of the anal canal using a small tube (anoscope).  A blood test, if you have lost a significant amount of blood.  A test to look inside the colon (sigmoidoscopy or colonoscopy).  How is this treated? This condition can usually be treated at home. However, various procedures may be done if dietary changes, lifestyle changes, and other home treatments do not help your symptoms. These procedures can help make the hemorrhoids smaller or remove them completely. Some of these procedures involve  surgery, and others do not. Common procedures include:  Rubber band ligation. Rubber bands are placed at the base of the hemorrhoids to cut off the blood supply to them.  Sclerotherapy. Medicine is injected into the hemorrhoids to shrink them.  Infrared coagulation. A type of light energy is used to get rid of the hemorrhoids.  Hemorrhoidectomy surgery. The hemorrhoids are surgically removed, and the veins that supply them are tied off.  Stapled hemorrhoidopexy surgery. A circular stapling device is used to remove the hemorrhoids and use staples to cut off the blood supply to them.  Follow these instructions at home: Eating and drinking  Eat foods that have a lot of fiber in them, such as whole grains, beans, nuts, fruits, and vegetables. Ask your health care provider about taking products that have added fiber (fiber supplements).  Drink enough fluid to keep your urine clear or pale yellow. Managing pain and swelling  Take warm sitz baths for 20 minutes, 3-4 times a day to ease pain and discomfort.  If directed, apply ice to the affected area. Using ice packs between sitz baths may be helpful. ? Put ice in a plastic bag. ? Place a towel between your skin and the bag. ? Leave the ice on for 20 minutes, 2-3 times a day. General instructions  Take over-the-counter and prescription medicines only as told by your health care provider.  Use medicated creams or suppositories as told.  Exercise regularly.  Go to the bathroom when you have the urge to have a bowel movement. Do not wait.    Avoid straining to have bowel movements.  Keep the anal area dry and clean. Use wet toilet paper or moist towelettes after a bowel movement.  Do not sit on the toilet for long periods of time. This increases blood pooling and pain. Contact a health care provider if:  You have increasing pain and swelling that are not controlled by treatment or medicine.  You have uncontrolled bleeding.  You  have difficulty having a bowel movement, or you are unable to have a bowel movement.  You have pain or inflammation outside the area of the hemorrhoids. This information is not intended to replace advice given to you by your health care provider. Make sure you discuss any questions you have with your health care provider. Document Released: 03/08/2000 Document Revised: 08/09/2015 Document Reviewed: 11/23/2014 Elsevier Interactive Patient Education  2017 [employer]  

## 2016-09-16 DIAGNOSIS — H532 Diplopia: Secondary | ICD-10-CM | POA: Diagnosis not present

## 2016-09-16 DIAGNOSIS — H04123 Dry eye syndrome of bilateral lacrimal glands: Secondary | ICD-10-CM | POA: Diagnosis not present

## 2016-09-16 DIAGNOSIS — H11011 Amyloid pterygium of right eye: Secondary | ICD-10-CM | POA: Diagnosis not present

## 2016-09-16 DIAGNOSIS — H11001 Unspecified pterygium of right eye: Secondary | ICD-10-CM | POA: Diagnosis not present

## 2016-09-26 DIAGNOSIS — E039 Hypothyroidism, unspecified: Secondary | ICD-10-CM | POA: Diagnosis not present

## 2016-09-30 DIAGNOSIS — Z6830 Body mass index (BMI) 30.0-30.9, adult: Secondary | ICD-10-CM | POA: Diagnosis not present

## 2016-09-30 DIAGNOSIS — E6609 Other obesity due to excess calories: Secondary | ICD-10-CM | POA: Diagnosis not present

## 2016-09-30 DIAGNOSIS — E039 Hypothyroidism, unspecified: Secondary | ICD-10-CM | POA: Diagnosis not present

## 2016-09-30 DIAGNOSIS — Z0000 Encounter for general adult medical examination without abnormal findings: Secondary | ICD-10-CM | POA: Diagnosis not present

## 2016-10-16 DIAGNOSIS — H04123 Dry eye syndrome of bilateral lacrimal glands: Secondary | ICD-10-CM | POA: Diagnosis not present

## 2016-11-11 DIAGNOSIS — Z6830 Body mass index (BMI) 30.0-30.9, adult: Secondary | ICD-10-CM | POA: Diagnosis not present

## 2016-11-11 DIAGNOSIS — Z1389 Encounter for screening for other disorder: Secondary | ICD-10-CM | POA: Diagnosis not present

## 2016-11-11 DIAGNOSIS — E6609 Other obesity due to excess calories: Secondary | ICD-10-CM | POA: Diagnosis not present

## 2016-11-11 DIAGNOSIS — B029 Zoster without complications: Secondary | ICD-10-CM | POA: Diagnosis not present

## 2016-11-26 DIAGNOSIS — Z79899 Other long term (current) drug therapy: Secondary | ICD-10-CM | POA: Diagnosis not present

## 2016-11-26 DIAGNOSIS — S61012A Laceration without foreign body of left thumb without damage to nail, initial encounter: Secondary | ICD-10-CM | POA: Diagnosis not present

## 2016-11-26 DIAGNOSIS — K219 Gastro-esophageal reflux disease without esophagitis: Secondary | ICD-10-CM | POA: Diagnosis not present

## 2016-11-26 DIAGNOSIS — M795 Residual foreign body in soft tissue: Secondary | ICD-10-CM | POA: Diagnosis not present

## 2016-11-26 DIAGNOSIS — W312XXA Contact with powered woodworking and forming machines, initial encounter: Secondary | ICD-10-CM | POA: Diagnosis not present

## 2016-12-02 DIAGNOSIS — R0789 Other chest pain: Secondary | ICD-10-CM | POA: Diagnosis not present

## 2016-12-02 DIAGNOSIS — K219 Gastro-esophageal reflux disease without esophagitis: Secondary | ICD-10-CM | POA: Diagnosis not present

## 2016-12-02 DIAGNOSIS — Z6830 Body mass index (BMI) 30.0-30.9, adult: Secondary | ICD-10-CM | POA: Diagnosis not present

## 2016-12-02 DIAGNOSIS — E6609 Other obesity due to excess calories: Secondary | ICD-10-CM | POA: Diagnosis not present

## 2016-12-19 DIAGNOSIS — K219 Gastro-esophageal reflux disease without esophagitis: Secondary | ICD-10-CM | POA: Diagnosis not present

## 2016-12-19 DIAGNOSIS — R1310 Dysphagia, unspecified: Secondary | ICD-10-CM | POA: Diagnosis not present

## 2016-12-26 DIAGNOSIS — K219 Gastro-esophageal reflux disease without esophagitis: Secondary | ICD-10-CM | POA: Diagnosis not present

## 2016-12-26 DIAGNOSIS — K2930 Chronic superficial gastritis without bleeding: Secondary | ICD-10-CM | POA: Diagnosis not present

## 2016-12-26 DIAGNOSIS — R1310 Dysphagia, unspecified: Secondary | ICD-10-CM | POA: Diagnosis not present

## 2016-12-31 ENCOUNTER — Other Ambulatory Visit (HOSPITAL_COMMUNITY): Payer: Self-pay | Admitting: Family Medicine

## 2016-12-31 DIAGNOSIS — K219 Gastro-esophageal reflux disease without esophagitis: Secondary | ICD-10-CM | POA: Diagnosis not present

## 2016-12-31 DIAGNOSIS — S62522B Displaced fracture of distal phalanx of left thumb, initial encounter for open fracture: Secondary | ICD-10-CM | POA: Diagnosis not present

## 2016-12-31 DIAGNOSIS — Z79899 Other long term (current) drug therapy: Secondary | ICD-10-CM | POA: Diagnosis not present

## 2016-12-31 DIAGNOSIS — Z23 Encounter for immunization: Secondary | ICD-10-CM | POA: Diagnosis not present

## 2016-12-31 DIAGNOSIS — W208XXA Other cause of strike by thrown, projected or falling object, initial encounter: Secondary | ICD-10-CM | POA: Diagnosis not present

## 2016-12-31 DIAGNOSIS — S62522A Displaced fracture of distal phalanx of left thumb, initial encounter for closed fracture: Secondary | ICD-10-CM | POA: Diagnosis not present

## 2016-12-31 DIAGNOSIS — Z87891 Personal history of nicotine dependence: Secondary | ICD-10-CM

## 2017-01-01 DIAGNOSIS — S6702XA Crushing injury of left thumb, initial encounter: Secondary | ICD-10-CM | POA: Diagnosis not present

## 2017-01-01 DIAGNOSIS — S62522B Displaced fracture of distal phalanx of left thumb, initial encounter for open fracture: Secondary | ICD-10-CM | POA: Diagnosis not present

## 2017-01-01 DIAGNOSIS — Z87891 Personal history of nicotine dependence: Secondary | ICD-10-CM | POA: Diagnosis not present

## 2017-01-01 DIAGNOSIS — Z91040 Latex allergy status: Secondary | ICD-10-CM | POA: Diagnosis not present

## 2017-01-01 DIAGNOSIS — Z886 Allergy status to analgesic agent status: Secondary | ICD-10-CM | POA: Diagnosis not present

## 2017-01-01 DIAGNOSIS — E039 Hypothyroidism, unspecified: Secondary | ICD-10-CM | POA: Diagnosis not present

## 2017-01-01 DIAGNOSIS — K219 Gastro-esophageal reflux disease without esophagitis: Secondary | ICD-10-CM | POA: Diagnosis not present

## 2017-01-03 DIAGNOSIS — M60242 Foreign body granuloma of soft tissue, not elsewhere classified, left hand: Secondary | ICD-10-CM | POA: Diagnosis not present

## 2017-01-03 DIAGNOSIS — K2930 Chronic superficial gastritis without bleeding: Secondary | ICD-10-CM | POA: Diagnosis not present

## 2017-01-09 DIAGNOSIS — S6702XA Crushing injury of left thumb, initial encounter: Secondary | ICD-10-CM | POA: Diagnosis not present

## 2017-01-09 DIAGNOSIS — S62522B Displaced fracture of distal phalanx of left thumb, initial encounter for open fracture: Secondary | ICD-10-CM | POA: Diagnosis not present

## 2017-01-13 ENCOUNTER — Ambulatory Visit (HOSPITAL_COMMUNITY)
Admission: RE | Admit: 2017-01-13 | Discharge: 2017-01-13 | Disposition: A | Discharge status: Home / Self Care | Payer: PPO | Source: Ambulatory Visit | Attending: Family Medicine | Admitting: Family Medicine

## 2017-01-13 DIAGNOSIS — Z122 Encounter for screening for malignant neoplasm of respiratory organs: Secondary | ICD-10-CM | POA: Insufficient documentation

## 2017-01-13 DIAGNOSIS — I2510 Atherosclerotic heart disease of native coronary artery without angina pectoris: Secondary | ICD-10-CM | POA: Diagnosis not present

## 2017-01-13 DIAGNOSIS — J439 Emphysema, unspecified: Secondary | ICD-10-CM | POA: Diagnosis not present

## 2017-01-13 DIAGNOSIS — Z87891 Personal history of nicotine dependence: Secondary | ICD-10-CM | POA: Insufficient documentation

## 2017-01-13 DIAGNOSIS — I700 Atherosclerosis of aorta: Secondary | ICD-10-CM | POA: Insufficient documentation

## 2017-01-22 DIAGNOSIS — S62522D Displaced fracture of distal phalanx of left thumb, subsequent encounter for fracture with routine healing: Secondary | ICD-10-CM | POA: Diagnosis not present

## 2017-02-02 NOTE — Progress Notes (Signed)
Cardiology Office Note  Date: 02/03/2017   ID: Lucas Leach, DOB Jul 19, 1950, MRN [id number]  PCP: Lucas Leach, John, MD  Primary Cardiologist: Lucas DellSamuel McDowell, MD   Chief Complaint  Patient presents with  . Cardiac follow-up     History of Present Illness: Lucas Leach is a 66 y.o. male seen in consultation back in May.  He presents to the office today, encouraged to do so by his PCP office in order to review a recent CT scan that was done for screening purposes.  He underwent a recent screening chest CT to exclude malignancy. He was incidentally noted to have evidence of moderate to severe coronary artery calcification. Of note, he underwent a Myoview study back in May of this year which was low risk without significant ischemia.  He reports chronic dyspnea on exertion, no recent changes.  No chest tightness.  He does experience a feeling of forceful heartbeats and headache, usually when he is still, not with exertion.  He does not feel like his stamina has changed since assessment back in May.  Today we discussed the findings of his chest CT.  We also plan to get his last lipid panel from Dr. Phillips Leach.  Past Medical History:  Diagnosis Date  . GERD (gastroesophageal reflux disease)   . Hypothyroidism   . Lumbar vertebral fracture (HCC)    L3    Past Surgical History:  Procedure Laterality Date  . ADENOIDECTOMY    . THUMB AMPUTATION Left 2018  . TONSILLECTOMY      Current Outpatient Medications  Medication Sig Dispense Refill  . aspirin EC 81 MG tablet Take 81 mg by mouth daily.    Marland Kitchen. levothyroxine (SYNTHROID, LEVOTHROID) 75 MCG tablet Take 75 mcg daily before breakfast by mouth.     . Multiple Vitamin (MULTIVITAMIN WITH MINERALS) TABS Take 1 tablet by mouth every other day.    Marland Kitchen. omeprazole (PRILOSEC) 40 MG capsule Take 1 capsule (40 mg total) by mouth daily. 90 capsule 3   No current facility-administered medications for this visit.    Allergies:  Codeine and Latex     Social History: The patient  reports that he has quit smoking. His smoking use included cigarettes. he has never used smokeless tobacco. He reports that he drinks alcohol. He reports that he does not use drugs.   ROS:  Please see the history of present illness. Otherwise, complete review of systems is positive for occasional mildly productive cough.  All other systems are reviewed and negative.   Physical Exam: VS:  BP 138/84   Pulse 66   Ht 6' (1.829 m)   Wt 217 lb (98.4 kg)   SpO2 98%   BMI 29.43 kg/m , BMI Body mass index is 29.43 kg/m.  Wt Readings from Last 3 Encounters:  02/03/17 217 lb (98.4 kg)  09/10/16 214 lb (97.1 kg)  08/01/16 211 lb (95.7 kg)    General: Overweight male, appears comfortable at rest. HEENT: Conjunctiva and lids normal, oropharynx clear. Neck: Supple, no elevated JVP or carotid bruits, no thyromegaly. Lungs: No wheezes, nonlabored breathing at rest. Cardiac: Regular rate and rhythm, no S3 or significant systolic murmur, no pericardial rub. Abdomen: Soft, nontender, bowel sounds present, no guarding or rebound. Extremities: No pitting edema, distal pulses 2+. Skin: Warm and dry. Musculoskeletal: No kyphosis. Neuropsychiatric: Alert and oriented x3, affect grossly appropriate.  ECG: I personally reviewed the tracing from 08/01/2016 which shows sinus rhythm with leftward axis and nonspecific T-wave changes.  Other Studies Reviewed Today:  Echocardiogram 08/08/2016: Study Conclusions  - Left ventricle: The cavity size was normal. Wall thickness was   increased in a pattern of mild LVH. The estimated ejection   fraction was 55%. Wall motion was normal; there were no regional   wall motion abnormalities. Left ventricular diastolic function   parameters were normal for the patient&'s age. - Aortic valve: Mildly calcified annulus. Trileaflet. - Mitral valve: There was trivial regurgitation. - Right atrium: Central venous pressure (est): 3 mm Hg. -  Atrial septum: No defect or patent foramen ovale was identified. - Tricuspid valve: There was trivial regurgitation. - Pulmonary arteries: PA peak pressure: 30 mm Hg (S). - Pericardium, extracardiac: There was no pericardial effusion.  Impressions:  - Mild LVH with LVEF approximately 55% and grossly normal diastolic   function for age. Trivial mitral regurgitation. Calcified aortic   annulus. Trivial tricuspid regurgitation with PASP estimated 30   mmHg.  Lexiscan Myoview 08/08/2016:  No diagnostic ST segment changes to indicate ischemia.  Small, mild intensity, anteroapical defect that is fixed, more prominent on rest imaging, and consistent with soft tissue attenuation. No large ischemic territories are noted.  This is a low risk study.  Nuclear stress EF: 71%.  Chest CT 01/13/2017: IMPRESSION: 1. Lung-RADS 1, negative. Continue annual screening with low-dose chest CT without contrast in 12 months. 2. Aortic atherosclerosis (ICD10-170.0). Moderate to severe coronary artery calcification. 3.  Emphysema (ICD10-J43.9).  Assessment and Plan:  1.  Coronary artery calcifications by recent chest CT imaging.  Patient has chronic dyspnea on exertion without recent change, reports no angina.  He underwent stress testing with low risk Myoview back in May of this year.  Today we discussed the findings of his chest CT as it relates to increased risk of cardiac events.  The chest CT findings do not necessarily suggest obstructive CAD however, it is reassuring that his Myoview did not show any large ischemic territories.  We have discussed warning signs and symptoms.  Recommend aspirin daily, we will also initiate statin therapy but I would like to review his lipids first.  Office follow-up arranged.  2.  Emphysema by recent chest CT.  Has a previous history of tobacco use.  Reports intermittent cough and chest congestion.  Would recommend that he have PFTs with Dr. Phillips Leach at some point.  3.   Hypothyroidism, on Synthroid.  4.  GERD, on Prilosec.  Current medicines were reviewed with the patient today.  Disposition: Follow-up in 6 months.  Signed, Jonelle SidleSamuel G. McDowell, MD, [facility] 02/03/2017 1:50 PM    [facility] [address], [place], [place] [address] Phone: [phone]; Fax: [phone]

## 2017-02-03 ENCOUNTER — Encounter: Payer: Self-pay | Admitting: Cardiology

## 2017-02-03 ENCOUNTER — Ambulatory Visit: Payer: PPO | Admitting: Cardiology

## 2017-02-03 VITALS — BP 138/84 | HR 66 | Ht 72.0 in | Wt 217.0 lb

## 2017-02-03 DIAGNOSIS — I2510 Atherosclerotic heart disease of native coronary artery without angina pectoris: Secondary | ICD-10-CM

## 2017-02-03 DIAGNOSIS — J439 Emphysema, unspecified: Secondary | ICD-10-CM

## 2017-02-03 DIAGNOSIS — K219 Gastro-esophageal reflux disease without esophagitis: Secondary | ICD-10-CM

## 2017-02-03 DIAGNOSIS — E039 Hypothyroidism, unspecified: Secondary | ICD-10-CM | POA: Diagnosis not present

## 2017-02-03 NOTE — Patient Instructions (Signed)
Your physician wants you to follow-up in:  6 months with Dr McDowell You will receive a reminder letter in the mail two months in advance. If you don't receive a letter, please call our office to schedule the follow-up appointment.    Your physician recommends that you continue on your current medications as directed. Please refer to the Current Medication list given to you today.     If you need a refill on your cardiac medications before your next appointment, please call your pharmacy.     No lab work or testing ordered today.      Thank you for choosing [facility] !        

## 2017-02-24 DIAGNOSIS — R972 Elevated prostate specific antigen [PSA]: Secondary | ICD-10-CM | POA: Diagnosis not present

## 2017-03-05 ENCOUNTER — Ambulatory Visit: Payer: PPO | Admitting: Urology

## 2017-03-05 DIAGNOSIS — R972 Elevated prostate specific antigen [PSA]: Secondary | ICD-10-CM | POA: Diagnosis not present

## 2017-03-05 DIAGNOSIS — R102 Pelvic and perineal pain: Secondary | ICD-10-CM | POA: Diagnosis not present

## 2017-04-11 DIAGNOSIS — K644 Residual hemorrhoidal skin tags: Secondary | ICD-10-CM | POA: Diagnosis not present

## 2017-04-11 DIAGNOSIS — K602 Anal fissure, unspecified: Secondary | ICD-10-CM | POA: Diagnosis not present

## 2017-04-23 DIAGNOSIS — J439 Emphysema, unspecified: Secondary | ICD-10-CM | POA: Diagnosis not present

## 2017-04-23 DIAGNOSIS — Z6830 Body mass index (BMI) 30.0-30.9, adult: Secondary | ICD-10-CM | POA: Diagnosis not present

## 2017-04-23 DIAGNOSIS — J449 Chronic obstructive pulmonary disease, unspecified: Secondary | ICD-10-CM | POA: Diagnosis not present

## 2017-04-23 DIAGNOSIS — E6609 Other obesity due to excess calories: Secondary | ICD-10-CM | POA: Diagnosis not present

## 2017-05-02 DIAGNOSIS — H93A9 Pulsatile tinnitus, unspecified ear: Secondary | ICD-10-CM | POA: Diagnosis not present

## 2017-05-02 DIAGNOSIS — R7309 Other abnormal glucose: Secondary | ICD-10-CM | POA: Diagnosis not present

## 2017-05-02 DIAGNOSIS — E039 Hypothyroidism, unspecified: Secondary | ICD-10-CM | POA: Diagnosis not present

## 2017-05-02 DIAGNOSIS — E782 Mixed hyperlipidemia: Secondary | ICD-10-CM | POA: Diagnosis not present

## 2017-05-02 DIAGNOSIS — Z6830 Body mass index (BMI) 30.0-30.9, adult: Secondary | ICD-10-CM | POA: Diagnosis not present

## 2017-05-02 DIAGNOSIS — J439 Emphysema, unspecified: Secondary | ICD-10-CM | POA: Diagnosis not present

## 2017-05-02 DIAGNOSIS — E6609 Other obesity due to excess calories: Secondary | ICD-10-CM | POA: Diagnosis not present

## 2017-05-09 ENCOUNTER — Other Ambulatory Visit (HOSPITAL_COMMUNITY): Payer: Self-pay | Admitting: Family Medicine

## 2017-05-09 DIAGNOSIS — H93A9 Pulsatile tinnitus, unspecified ear: Secondary | ICD-10-CM

## 2017-05-16 ENCOUNTER — Ambulatory Visit (HOSPITAL_COMMUNITY)
Admission: RE | Admit: 2017-05-16 | Discharge: 2017-05-16 | Disposition: A | Discharge status: Home / Self Care | Payer: PPO | Source: Ambulatory Visit | Attending: Family Medicine | Admitting: Family Medicine

## 2017-05-16 DIAGNOSIS — H93A9 Pulsatile tinnitus, unspecified ear: Secondary | ICD-10-CM | POA: Diagnosis not present

## 2017-05-16 DIAGNOSIS — R51 Headache: Secondary | ICD-10-CM | POA: Diagnosis not present

## 2017-05-16 LAB — POCT I-STAT CREATININE: Creatinine, Ser: 0.9 mg/dL (ref 0.61–1.24)

## 2017-05-16 MED ORDER — GADOBENATE DIMEGLUMINE 529 MG/ML IV SOLN
20.0000 mL | Freq: Once | INTRAVENOUS | Status: AC | PRN
Start: 2017-05-16 — End: 2017-05-16
  Administered 2017-05-16: 20 mL via INTRAVENOUS

## 2017-06-05 ENCOUNTER — Ambulatory Visit (INDEPENDENT_AMBULATORY_CARE_PROVIDER_SITE_OTHER): Payer: PPO | Admitting: Otolaryngology

## 2017-06-05 DIAGNOSIS — J342 Deviated nasal septum: Secondary | ICD-10-CM | POA: Diagnosis not present

## 2017-06-05 DIAGNOSIS — H9313 Tinnitus, bilateral: Secondary | ICD-10-CM

## 2017-06-05 DIAGNOSIS — J343 Hypertrophy of nasal turbinates: Secondary | ICD-10-CM

## 2017-06-05 DIAGNOSIS — H903 Sensorineural hearing loss, bilateral: Secondary | ICD-10-CM | POA: Diagnosis not present

## 2017-06-06 ENCOUNTER — Other Ambulatory Visit (INDEPENDENT_AMBULATORY_CARE_PROVIDER_SITE_OTHER): Payer: Self-pay | Admitting: Otolaryngology

## 2017-06-06 DIAGNOSIS — H9312 Tinnitus, left ear: Secondary | ICD-10-CM

## 2017-06-06 DIAGNOSIS — H93A9 Pulsatile tinnitus, unspecified ear: Secondary | ICD-10-CM

## 2017-10-30 DIAGNOSIS — Z6830 Body mass index (BMI) 30.0-30.9, adult: Secondary | ICD-10-CM | POA: Diagnosis not present

## 2017-10-30 DIAGNOSIS — Z1389 Encounter for screening for other disorder: Secondary | ICD-10-CM | POA: Diagnosis not present

## 2017-10-30 DIAGNOSIS — E6609 Other obesity due to excess calories: Secondary | ICD-10-CM | POA: Diagnosis not present

## 2017-10-30 DIAGNOSIS — J449 Chronic obstructive pulmonary disease, unspecified: Secondary | ICD-10-CM | POA: Diagnosis not present

## 2017-10-30 DIAGNOSIS — J069 Acute upper respiratory infection, unspecified: Secondary | ICD-10-CM | POA: Diagnosis not present

## 2017-11-19 DIAGNOSIS — R972 Elevated prostate specific antigen [PSA]: Secondary | ICD-10-CM | POA: Diagnosis not present

## 2017-11-19 DIAGNOSIS — J439 Emphysema, unspecified: Secondary | ICD-10-CM | POA: Diagnosis not present

## 2017-11-19 DIAGNOSIS — E782 Mixed hyperlipidemia: Secondary | ICD-10-CM | POA: Diagnosis not present

## 2017-11-19 DIAGNOSIS — E039 Hypothyroidism, unspecified: Secondary | ICD-10-CM | POA: Diagnosis not present

## 2017-11-19 DIAGNOSIS — J449 Chronic obstructive pulmonary disease, unspecified: Secondary | ICD-10-CM | POA: Diagnosis not present

## 2017-11-19 DIAGNOSIS — Z0001 Encounter for general adult medical examination with abnormal findings: Secondary | ICD-10-CM | POA: Diagnosis not present

## 2017-11-19 DIAGNOSIS — Z6829 Body mass index (BMI) 29.0-29.9, adult: Secondary | ICD-10-CM | POA: Diagnosis not present

## 2017-11-19 DIAGNOSIS — E663 Overweight: Secondary | ICD-10-CM | POA: Diagnosis not present

## 2017-11-19 DIAGNOSIS — Z1389 Encounter for screening for other disorder: Secondary | ICD-10-CM | POA: Diagnosis not present

## 2017-11-26 ENCOUNTER — Ambulatory Visit: Payer: PPO | Admitting: Urology

## 2017-11-26 DIAGNOSIS — R972 Elevated prostate specific antigen [PSA]: Secondary | ICD-10-CM | POA: Diagnosis not present

## 2017-11-26 DIAGNOSIS — N401 Enlarged prostate with lower urinary tract symptoms: Secondary | ICD-10-CM

## 2017-12-25 ENCOUNTER — Other Ambulatory Visit: Payer: Self-pay

## 2017-12-25 DIAGNOSIS — M79672 Pain in left foot: Secondary | ICD-10-CM | POA: Diagnosis not present

## 2017-12-25 NOTE — Patient Outreach (Signed)
[facility]) Care Management  12/25/2017  Raphel Stickles Tamburri [birth year] [id number]   Referral Date: 12-24-17 Referral Source: HTA Concierge Referral Reason: In coverage gap   Outreach Attempt: No answer.  HIPAA compliant voice message left.     Plan: RN CM will attempt patient again within 4 business days and send letter.   Bary Leriche, RN, MSN [facility] Care Management Care Management Coordinator Direct Line [phone] Toll Free: [phone]  Fax: [phone]

## 2017-12-29 ENCOUNTER — Other Ambulatory Visit: Payer: Self-pay

## 2017-12-29 NOTE — Patient Outreach (Signed)
[facility]) Care Management  12/29/2017  Lucas Leach [birth year] [id number]   Referral Date: 12-24-17 Referral Source: HTA Concierge Referral Reason: In coverage gap   Outreach Attempt: No answer.  HIPAA compliant voice message left.     Plan: RN CM will attempt patient again within 4 business days.   Bary Leriche, RN, MSN [facility] Care Management Care Management Coordinator Direct Line [phone] Toll Free: [phone]  Fax: [phone]

## 2017-12-30 ENCOUNTER — Ambulatory Visit: Payer: Self-pay

## 2018-01-02 ENCOUNTER — Other Ambulatory Visit: Payer: Self-pay

## 2018-01-02 NOTE — Patient Outreach (Signed)
[facility]) Care Management  01/02/2018  Lucas Leach Mar 21, 1951 [id number]   Referral Date:12-24-17 Referral Source:HTA Concierge Referral Reason:In coverage gap   Outreach Attempt:No answer. HIPAA compliant voice message left.    Plan:RN CM will wait return call.  If no return call will close case.  Bary Leriche, RN, MSN [facility] Care Management Care Management Coordinator Direct Line [phone] Cell [phone] Toll Free: [phone]  Fax: [phone]

## 2018-01-08 ENCOUNTER — Other Ambulatory Visit: Payer: Self-pay

## 2018-01-08 NOTE — Patient Outreach (Signed)
[facility]) Care Management  01/08/2018  Lucas Leach 1950/10/03 [id number]   Multiple attempts to establish contact with patient without success. No response from letter mailed to patient.   Plan: RN CM will close case at this time.   Bary Leriche, RN, MSN [facility] Care Management Care Management Coordinator Direct Line [phone] Cell [phone] Toll Free: [phone]  Fax: [phone]

## 2018-03-23 DIAGNOSIS — J449 Chronic obstructive pulmonary disease, unspecified: Secondary | ICD-10-CM | POA: Diagnosis not present

## 2018-03-23 DIAGNOSIS — J019 Acute sinusitis, unspecified: Secondary | ICD-10-CM | POA: Diagnosis not present

## 2018-03-23 DIAGNOSIS — J343 Hypertrophy of nasal turbinates: Secondary | ICD-10-CM | POA: Diagnosis not present

## 2018-03-23 DIAGNOSIS — Z6830 Body mass index (BMI) 30.0-30.9, adult: Secondary | ICD-10-CM | POA: Diagnosis not present

## 2018-03-23 DIAGNOSIS — R05 Cough: Secondary | ICD-10-CM | POA: Diagnosis not present

## 2018-03-23 DIAGNOSIS — Z1389 Encounter for screening for other disorder: Secondary | ICD-10-CM | POA: Diagnosis not present

## 2018-03-23 DIAGNOSIS — E6609 Other obesity due to excess calories: Secondary | ICD-10-CM | POA: Diagnosis not present

## 2018-04-26 IMAGING — CT CT CHEST LUNG CANCER SCREENING LOW DOSE W/O CM
2 of 4 series · 15 of 40 positions shown, 18 images · non-contrast
Comparison: None.

CLINICAL DATA: Former smoker, quit 12 years ago, lung cancer
screening.

EXAM:
CT CHEST WITHOUT CONTRAST LOW-DOSE FOR LUNG CANCER SCREENING
TECHNIQUE: Multidetector CT imaging of the chest was performed following the
standard protocol without IV contrast.

[Series 2: axial st · axial · 0.70mm/px · z∈[+1290,+1520]mm · 12 of 56 slices shown, 15 images]
[im 5/56  mediastinal]
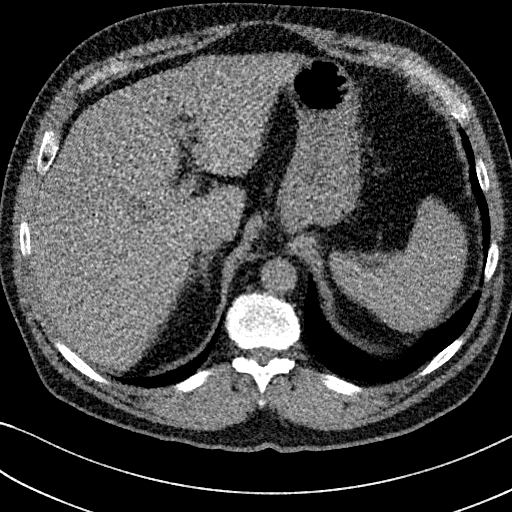
[im 5/56  lung]
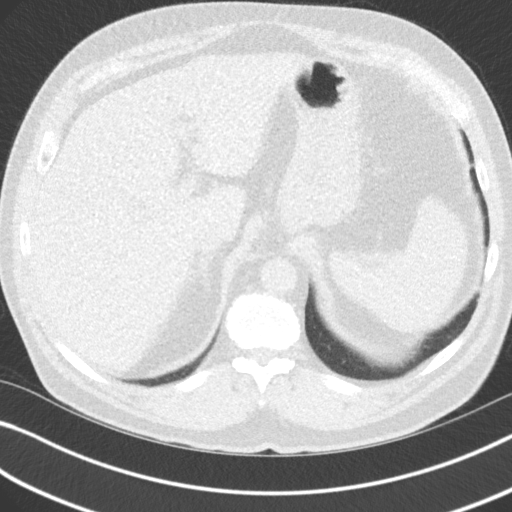
[im 9/56  lung]
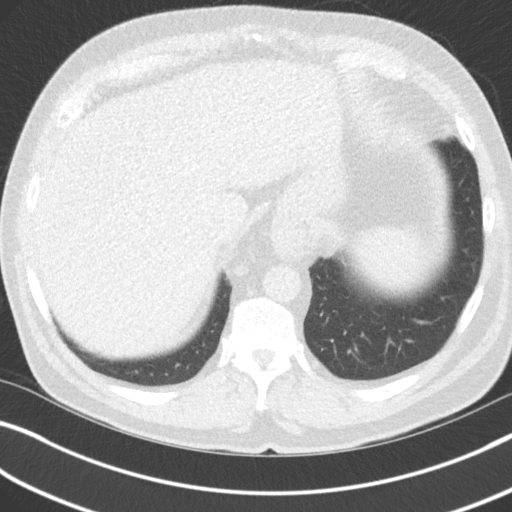
[im 13/56  lung]
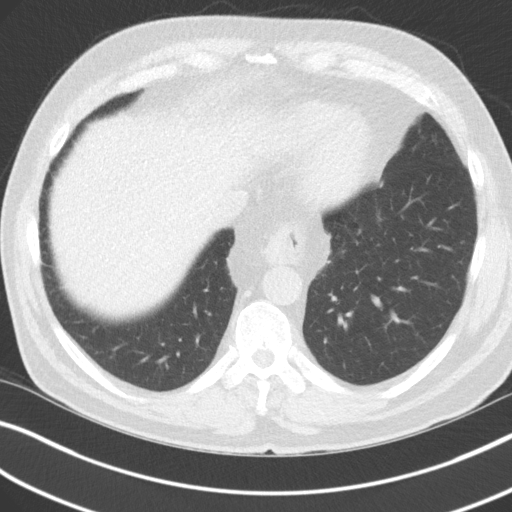
[im 17/56  lung]
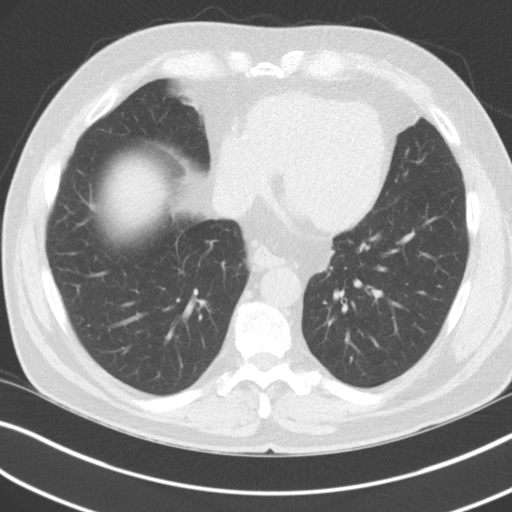
[im 22/56  mediastinal]
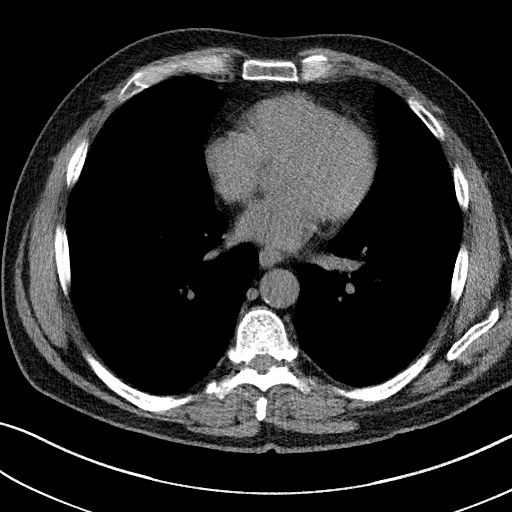
[im 22/56  lung]
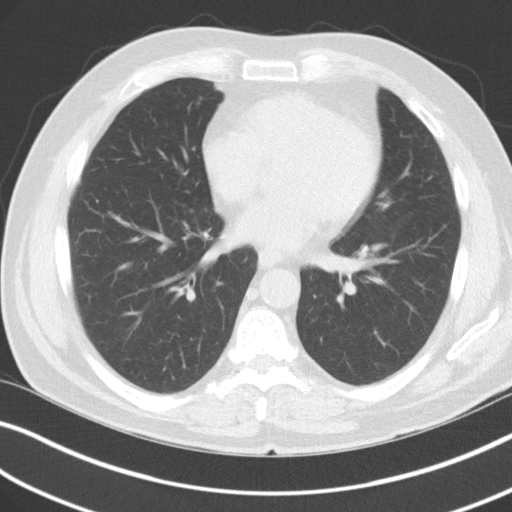
[im 26/56  lung]
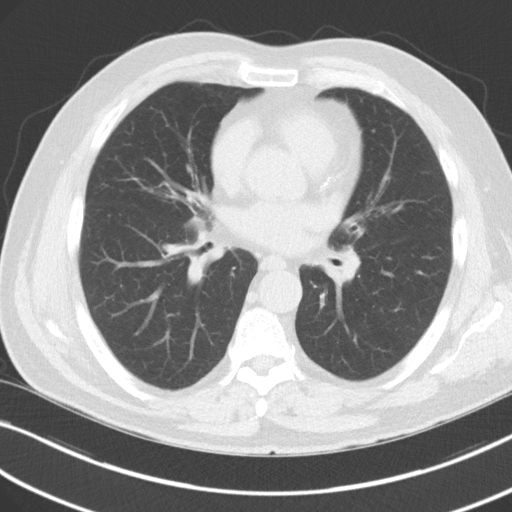
[im 30/56  lung]
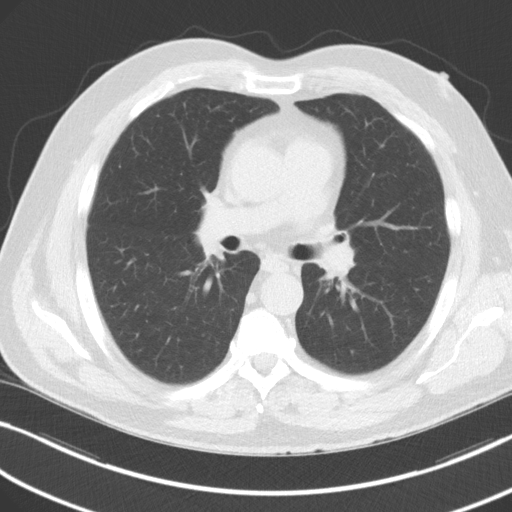
[im 34/56  lung]
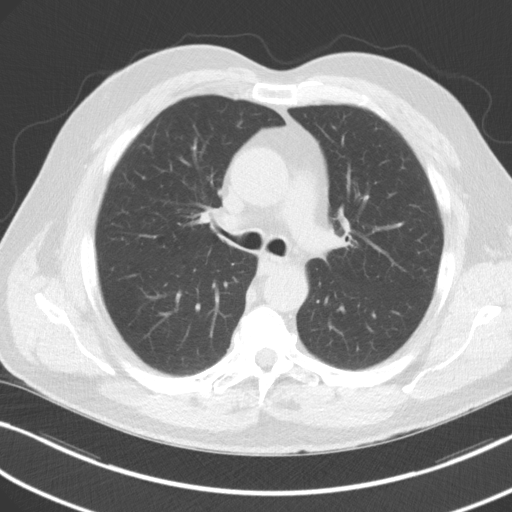
[im 39/56  mediastinal]
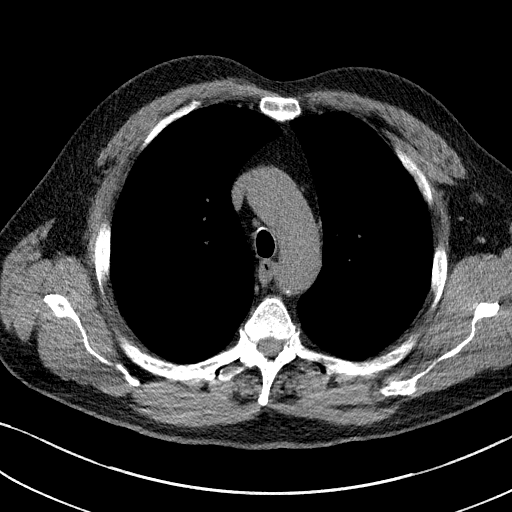
[im 39/56  lung]
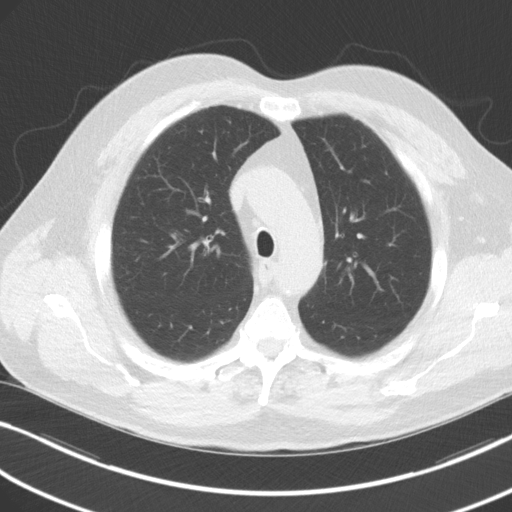
[im 43/56  lung]
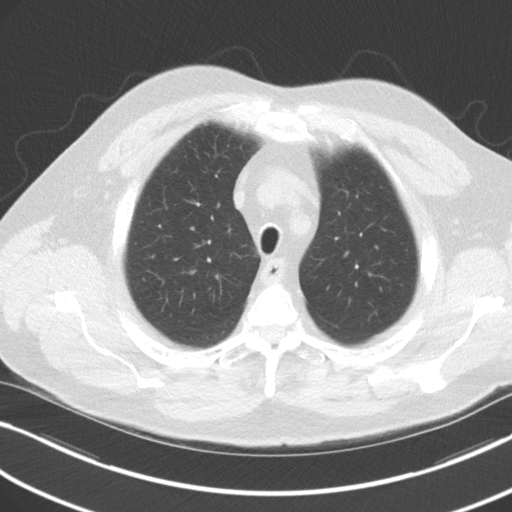
[im 47/56  lung]
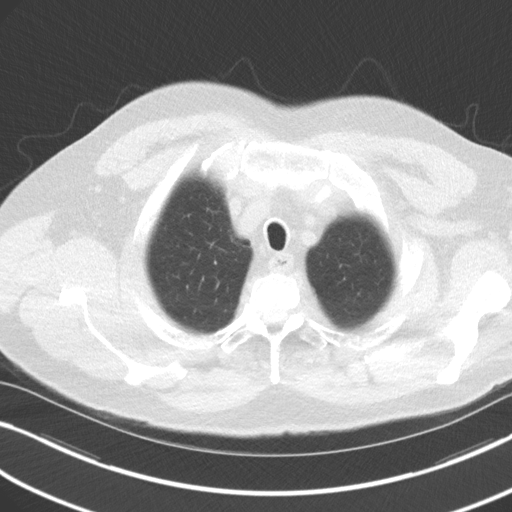
[im 51/56  lung]
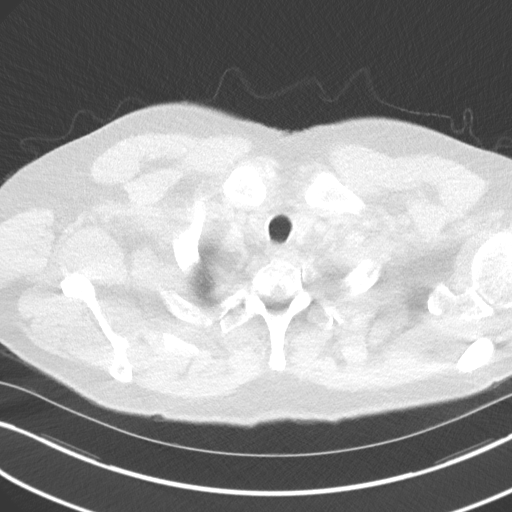

[Series 5: coronal · coronal · 0.58mm/px · 3 of 301 slices shown]
[im 61/301  lung]
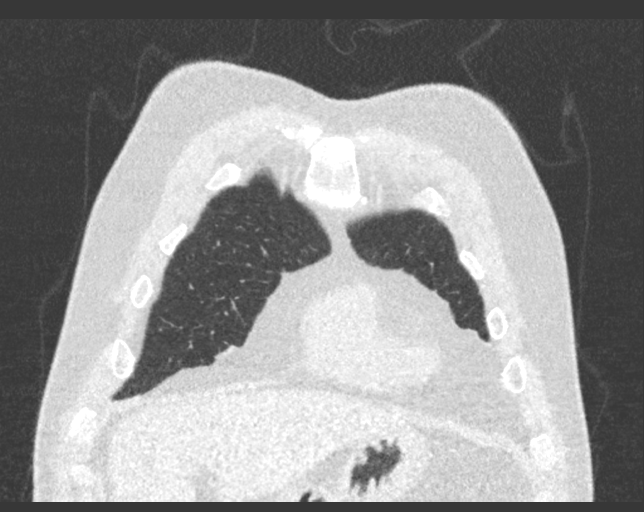
[im 121/301  lung]
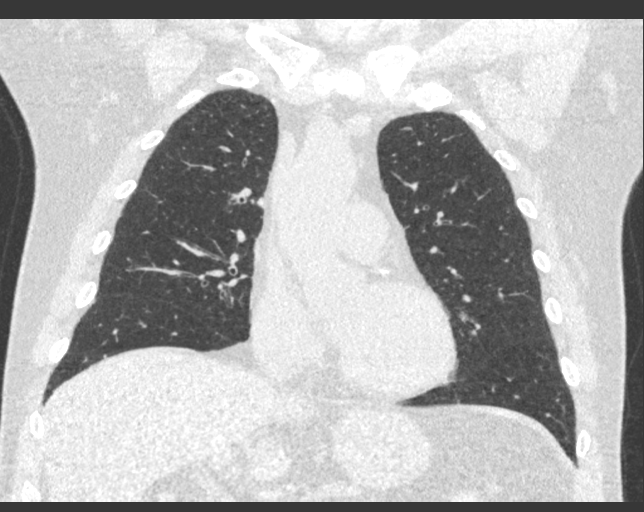
[im 181/301  lung]
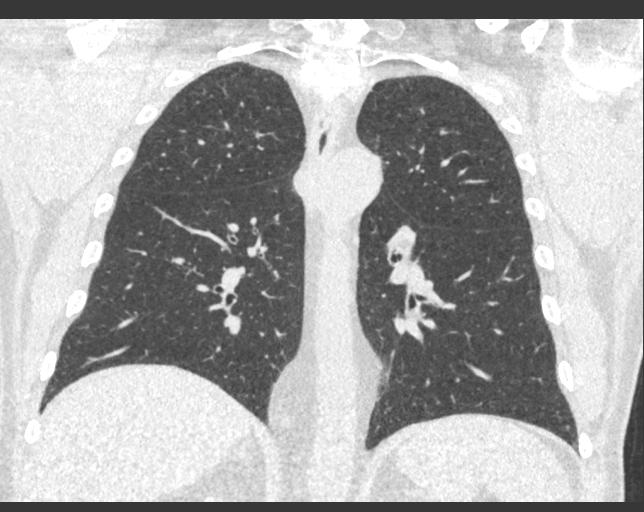

[15 of 40 positions shown; findings below may reference images not displayed]

FINDINGS: Cardiovascular: Atherosclerotic calcification of the arterial
vasculature, including moderate-to-severe involvement of the
coronary arteries. Heart size normal. No pericardial effusion.

Mediastinum/Nodes: No pathologically enlarged mediastinal or
axillary lymph nodes. Hilar regions are difficult to evaluate
without IV contrast. Esophagus is grossly unremarkable.

Lungs/Pleura: Mild centrilobular and paraseptal emphysema. No
worrisome pulmonary nodules. No pleural fluid. Airway is
unremarkable.

Upper Abdomen: Visualized portions of the liver, right adrenal
glands, right kidney and spleen are grossly unremarkable. Small
hiatal hernia.

Musculoskeletal: Degenerative changes in the spine. No worrisome
lytic or sclerotic lesions.
IMPRESSION: 1. Lung-RADS 1, negative. Continue annual screening with low-dose
chest CT without contrast in 12 months.
2. Aortic atherosclerosis (XTI1L-170.0). Moderate to severe coronary
artery calcification.
3.  Emphysema (XTI1L-IAM.R).

## 2018-06-04 ENCOUNTER — Ambulatory Visit (INDEPENDENT_AMBULATORY_CARE_PROVIDER_SITE_OTHER): Payer: PPO | Admitting: Otolaryngology

## 2018-07-07 DIAGNOSIS — I10 Essential (primary) hypertension: Secondary | ICD-10-CM | POA: Diagnosis not present

## 2018-07-07 DIAGNOSIS — R7301 Impaired fasting glucose: Secondary | ICD-10-CM | POA: Diagnosis not present

## 2018-07-07 DIAGNOSIS — E785 Hyperlipidemia, unspecified: Secondary | ICD-10-CM | POA: Diagnosis not present

## 2018-07-07 DIAGNOSIS — Z1389 Encounter for screening for other disorder: Secondary | ICD-10-CM | POA: Diagnosis not present

## 2018-07-07 DIAGNOSIS — Z681 Body mass index (BMI) 19 or less, adult: Secondary | ICD-10-CM | POA: Diagnosis not present

## 2018-07-07 DIAGNOSIS — M79605 Pain in left leg: Secondary | ICD-10-CM | POA: Diagnosis not present

## 2018-07-07 DIAGNOSIS — R7309 Other abnormal glucose: Secondary | ICD-10-CM | POA: Diagnosis not present

## 2018-07-07 DIAGNOSIS — J439 Emphysema, unspecified: Secondary | ICD-10-CM | POA: Diagnosis not present

## 2018-07-07 DIAGNOSIS — Z0001 Encounter for general adult medical examination with abnormal findings: Secondary | ICD-10-CM | POA: Diagnosis not present

## 2018-07-07 DIAGNOSIS — R0602 Shortness of breath: Secondary | ICD-10-CM | POA: Diagnosis not present

## 2018-07-07 DIAGNOSIS — E039 Hypothyroidism, unspecified: Secondary | ICD-10-CM | POA: Diagnosis not present

## 2018-07-07 DIAGNOSIS — R509 Fever, unspecified: Secondary | ICD-10-CM | POA: Diagnosis not present

## 2018-07-07 DIAGNOSIS — Z23 Encounter for immunization: Secondary | ICD-10-CM | POA: Diagnosis not present

## 2018-09-01 DIAGNOSIS — M25521 Pain in right elbow: Secondary | ICD-10-CM | POA: Diagnosis not present

## 2018-09-21 DIAGNOSIS — H43812 Vitreous degeneration, left eye: Secondary | ICD-10-CM | POA: Diagnosis not present

## 2018-10-08 DIAGNOSIS — H2512 Age-related nuclear cataract, left eye: Secondary | ICD-10-CM | POA: Diagnosis not present

## 2018-11-09 DIAGNOSIS — H2512 Age-related nuclear cataract, left eye: Secondary | ICD-10-CM | POA: Diagnosis not present

## 2018-11-09 DIAGNOSIS — H25812 Combined forms of age-related cataract, left eye: Secondary | ICD-10-CM | POA: Diagnosis not present

## 2018-12-24 DIAGNOSIS — H43812 Vitreous degeneration, left eye: Secondary | ICD-10-CM | POA: Diagnosis not present

## 2018-12-25 DIAGNOSIS — R972 Elevated prostate specific antigen [PSA]: Secondary | ICD-10-CM | POA: Diagnosis not present

## 2018-12-30 ENCOUNTER — Ambulatory Visit (INDEPENDENT_AMBULATORY_CARE_PROVIDER_SITE_OTHER): Payer: PPO | Admitting: Urology

## 2018-12-30 DIAGNOSIS — N401 Enlarged prostate with lower urinary tract symptoms: Secondary | ICD-10-CM

## 2018-12-30 DIAGNOSIS — R972 Elevated prostate specific antigen [PSA]: Secondary | ICD-10-CM | POA: Diagnosis not present

## 2019-01-01 DIAGNOSIS — E039 Hypothyroidism, unspecified: Secondary | ICD-10-CM | POA: Diagnosis not present

## 2019-01-01 DIAGNOSIS — E7849 Other hyperlipidemia: Secondary | ICD-10-CM | POA: Diagnosis not present

## 2019-01-01 DIAGNOSIS — Z6830 Body mass index (BMI) 30.0-30.9, adult: Secondary | ICD-10-CM | POA: Diagnosis not present

## 2019-01-01 DIAGNOSIS — J449 Chronic obstructive pulmonary disease, unspecified: Secondary | ICD-10-CM | POA: Diagnosis not present

## 2019-03-26 DIAGNOSIS — R0789 Other chest pain: Secondary | ICD-10-CM | POA: Diagnosis not present

## 2019-03-26 DIAGNOSIS — K228 Other specified diseases of esophagus: Secondary | ICD-10-CM | POA: Diagnosis not present

## 2019-03-26 DIAGNOSIS — K208 Other esophagitis without bleeding: Secondary | ICD-10-CM | POA: Diagnosis not present

## 2019-03-26 DIAGNOSIS — Y998 Other external cause status: Secondary | ICD-10-CM | POA: Diagnosis not present

## 2019-03-26 DIAGNOSIS — E039 Hypothyroidism, unspecified: Secondary | ICD-10-CM | POA: Diagnosis not present

## 2019-03-26 DIAGNOSIS — M4856XA Collapsed vertebra, not elsewhere classified, lumbar region, initial encounter for fracture: Secondary | ICD-10-CM | POA: Diagnosis not present

## 2019-03-26 DIAGNOSIS — K449 Diaphragmatic hernia without obstruction or gangrene: Secondary | ICD-10-CM | POA: Diagnosis not present

## 2019-03-26 DIAGNOSIS — K209 Esophagitis, unspecified without bleeding: Secondary | ICD-10-CM | POA: Diagnosis not present

## 2019-03-26 DIAGNOSIS — Z79899 Other long term (current) drug therapy: Secondary | ICD-10-CM | POA: Diagnosis not present

## 2019-03-26 DIAGNOSIS — Z20828 Contact with and (suspected) exposure to other viral communicable diseases: Secondary | ICD-10-CM | POA: Diagnosis not present

## 2019-03-26 DIAGNOSIS — K222 Esophageal obstruction: Secondary | ICD-10-CM | POA: Diagnosis not present

## 2019-03-26 DIAGNOSIS — I700 Atherosclerosis of aorta: Secondary | ICD-10-CM | POA: Diagnosis not present

## 2019-03-26 DIAGNOSIS — T18128A Food in esophagus causing other injury, initial encounter: Secondary | ICD-10-CM | POA: Diagnosis not present

## 2019-03-26 DIAGNOSIS — K760 Fatty (change of) liver, not elsewhere classified: Secondary | ICD-10-CM | POA: Diagnosis not present

## 2019-03-26 DIAGNOSIS — X58XXXA Exposure to other specified factors, initial encounter: Secondary | ICD-10-CM | POA: Diagnosis not present

## 2019-03-26 DIAGNOSIS — R1310 Dysphagia, unspecified: Secondary | ICD-10-CM | POA: Diagnosis not present

## 2019-03-26 DIAGNOSIS — I444 Left anterior fascicular block: Secondary | ICD-10-CM | POA: Diagnosis not present

## 2019-03-26 DIAGNOSIS — R079 Chest pain, unspecified: Secondary | ICD-10-CM | POA: Diagnosis not present

## 2019-03-26 DIAGNOSIS — Z91040 Latex allergy status: Secondary | ICD-10-CM | POA: Diagnosis not present

## 2019-03-26 DIAGNOSIS — K219 Gastro-esophageal reflux disease without esophagitis: Secondary | ICD-10-CM | POA: Diagnosis not present

## 2019-03-26 DIAGNOSIS — Z20822 Contact with and (suspected) exposure to covid-19: Secondary | ICD-10-CM | POA: Diagnosis not present

## 2019-04-29 DIAGNOSIS — E039 Hypothyroidism, unspecified: Secondary | ICD-10-CM | POA: Diagnosis not present

## 2019-04-29 DIAGNOSIS — E7849 Other hyperlipidemia: Secondary | ICD-10-CM | POA: Diagnosis not present

## 2019-04-29 DIAGNOSIS — Z6830 Body mass index (BMI) 30.0-30.9, adult: Secondary | ICD-10-CM | POA: Diagnosis not present

## 2019-04-29 DIAGNOSIS — J449 Chronic obstructive pulmonary disease, unspecified: Secondary | ICD-10-CM | POA: Diagnosis not present

## 2019-06-23 DIAGNOSIS — Z87891 Personal history of nicotine dependence: Secondary | ICD-10-CM | POA: Diagnosis not present

## 2019-06-23 DIAGNOSIS — I10 Essential (primary) hypertension: Secondary | ICD-10-CM | POA: Diagnosis not present

## 2019-06-23 DIAGNOSIS — J449 Chronic obstructive pulmonary disease, unspecified: Secondary | ICD-10-CM | POA: Diagnosis not present

## 2019-06-23 DIAGNOSIS — E039 Hypothyroidism, unspecified: Secondary | ICD-10-CM | POA: Diagnosis not present

## 2019-08-17 DIAGNOSIS — Z0000 Encounter for general adult medical examination without abnormal findings: Secondary | ICD-10-CM | POA: Diagnosis not present

## 2019-08-17 DIAGNOSIS — E039 Hypothyroidism, unspecified: Secondary | ICD-10-CM | POA: Diagnosis not present

## 2019-08-17 DIAGNOSIS — E663 Overweight: Secondary | ICD-10-CM | POA: Diagnosis not present

## 2019-08-17 DIAGNOSIS — Z6829 Body mass index (BMI) 29.0-29.9, adult: Secondary | ICD-10-CM | POA: Diagnosis not present

## 2019-08-17 DIAGNOSIS — E7849 Other hyperlipidemia: Secondary | ICD-10-CM | POA: Diagnosis not present

## 2019-08-17 DIAGNOSIS — R7309 Other abnormal glucose: Secondary | ICD-10-CM | POA: Diagnosis not present

## 2019-08-17 DIAGNOSIS — Z1389 Encounter for screening for other disorder: Secondary | ICD-10-CM | POA: Diagnosis not present

## 2019-10-22 DIAGNOSIS — Z87891 Personal history of nicotine dependence: Secondary | ICD-10-CM | POA: Diagnosis not present

## 2019-10-22 DIAGNOSIS — J449 Chronic obstructive pulmonary disease, unspecified: Secondary | ICD-10-CM | POA: Diagnosis not present

## 2019-10-22 DIAGNOSIS — E039 Hypothyroidism, unspecified: Secondary | ICD-10-CM | POA: Diagnosis not present

## 2019-10-22 DIAGNOSIS — I10 Essential (primary) hypertension: Secondary | ICD-10-CM | POA: Diagnosis not present

## 2019-12-14 ENCOUNTER — Other Ambulatory Visit: Payer: Self-pay

## 2019-12-14 DIAGNOSIS — R972 Elevated prostate specific antigen [PSA]: Secondary | ICD-10-CM

## 2019-12-23 DIAGNOSIS — I10 Essential (primary) hypertension: Secondary | ICD-10-CM | POA: Diagnosis not present

## 2019-12-23 DIAGNOSIS — Z87891 Personal history of nicotine dependence: Secondary | ICD-10-CM | POA: Diagnosis not present

## 2019-12-23 DIAGNOSIS — E039 Hypothyroidism, unspecified: Secondary | ICD-10-CM | POA: Diagnosis not present

## 2019-12-23 DIAGNOSIS — J449 Chronic obstructive pulmonary disease, unspecified: Secondary | ICD-10-CM | POA: Diagnosis not present

## 2019-12-29 ENCOUNTER — Other Ambulatory Visit: Payer: Self-pay

## 2019-12-29 ENCOUNTER — Other Ambulatory Visit: Payer: PPO

## 2019-12-29 DIAGNOSIS — R972 Elevated prostate specific antigen [PSA]: Secondary | ICD-10-CM | POA: Diagnosis not present

## 2019-12-30 LAB — PSA: Prostate Specific Ag, Serum: 3.2 ng/mL (ref 0.0–4.0)

## 2020-01-05 ENCOUNTER — Ambulatory Visit (INDEPENDENT_AMBULATORY_CARE_PROVIDER_SITE_OTHER): Payer: PPO | Admitting: Urology

## 2020-01-05 ENCOUNTER — Other Ambulatory Visit: Payer: Self-pay

## 2020-01-05 ENCOUNTER — Encounter: Payer: Self-pay | Admitting: Urology

## 2020-01-05 DIAGNOSIS — R972 Elevated prostate specific antigen [PSA]: Secondary | ICD-10-CM

## 2020-01-05 DIAGNOSIS — R351 Nocturia: Secondary | ICD-10-CM

## 2020-01-05 DIAGNOSIS — N401 Enlarged prostate with lower urinary tract symptoms: Secondary | ICD-10-CM

## 2020-01-05 LAB — URINALYSIS, ROUTINE W REFLEX MICROSCOPIC
Bilirubin, UA: NEGATIVE
Glucose, UA: NEGATIVE
Ketones, UA: NEGATIVE
Leukocytes,UA: NEGATIVE
Nitrite, UA: NEGATIVE
Protein,UA: NEGATIVE
RBC, UA: NEGATIVE
Specific Gravity, UA: >1.03 — ABNORMAL HIGH (ref 1.005–1.030)
Urobilinogen, Ur: 0.2 mg/dL (ref 0.2–1.0)
pH, UA: 5 (ref 5.0–7.5)

## 2020-01-05 MED ORDER — CLOTRIMAZOLE-BETAMETHASONE 1-0.05 % EX CREA: 1.0000 "application " | TOPICAL_CREAM | Freq: Two times a day (BID) | CUTANEOUS | 3 refills | Status: DC

## 2020-01-05 MED ORDER — ALFUZOSIN HCL ER 10 MG PO TB24: 10.0000 mg | ORAL_TABLET | Freq: Every day | ORAL | 11 refills | Status: DC

## 2020-01-05 NOTE — Progress Notes (Signed)
01/05/2020 3:20 PM   Lucas Leach Dec 12, 1950 [id number]  Referring provider: Assunta Found, MD [address] [place],  [place] [address]  Followup elevated PSA and BPH  HPI: Lucas Leach is a 69yo here for followup for elevated PSA and BPH with nocturia. He has stable LUTS with fluid management and timed voiding. He is not taking any BPH meds.  Nocturia 1x. Rare urinary urgency or frequency. No dysuria, no straining to urinate, no starting/stopping.  He does have a weak stream and hesitancy.  PSA is stable at 3.2.   His records from [facility] are as follows: My PSA is elevated above the normal range.  HPI: Lucas Leach is a 69 year-old male established patient who is here for an elevated PSA.  His PSA is 4.0. He indicates there were no urinary problems when the PSA was drawn. He has not had a prostate nodule on a physical examination. He has not had recurrent prostate infections or chronic prostatitis.   He has not been on antibiotics for prostate infections previously. He has had a prostate biopsy done. His first prostate biopsy was done approximately 10/24/2010. He does not have the pathology report from his biopsy.   11/22/2015: The patient is seen in consult for elevated PSA. He is referred by Dr Phillips Odor. His PSA has been increasing over the past several years but has never been over 4. His biggest complaint is weak stream.   02/28/2016: PSA on recheck was 3.1 with 14.5% free PSA. No new LUTS, he has nocturia 1x   08/28/2016: His repeat PSA was 4.3 last weak, up from 3.1   03/05/2017: PSA is 3.0 down from 4.3   11/26/2017: PSA is stable at 3.1. no new LUTS   12/30/2018: PSa decreased to 2.9.     CC: I have symptoms of an enlarged prostate.  HPI: He first noticed the symptoms approximately 10/23/2013. His symptoms have gotten worse over the last year. He has not been treated with medications in the past for his lower urinary tract symptoms. The patient has never had a surgical procedure  for bladder outlet obstruction to his prostate.   He usually gets up at night to urinate 1 time. He does not have to wait a long time to start his urinary stream. He does not have to strain or bear down to start his urinary stream. He does not have a good size and strength to his urinary stream. He does not dribble at the end of urination. He is not having problems with emptying his bladder well.   His urine has not shut off completely. He has not previously had an indwelling catheter in for more than two weeks at a time.   He has had a PSA done. He does not have any family members who have been diagnosed with prostate cancer.   02/28/2016: He has nocturia 1x which is related to fluid consumption prior to bedtime   08/28/2016: He has occasional weak steam which does not bother him.   11/26/2017: He has an intermittent weak stream which does not bother him. Nocturia 1-2x   12/30/2018: He has stable mild LUTS     AUA Symptom Score: Less than 20% of the time he has the sensation of not emptying his bladder completely when finished urinating. Less than 20% of the time he has to urinate again fewer than two hours after he has finished urinating. Less than 20% of the time he has to start and stop again several times  when he urinates. 50% of the time he has a weak urinary stream. Less than 20% of the time he has to push or strain to begin urination. He has to get up to urinate 1 time from the time he goes to bed until the time he gets up in the morning.        PMH: Past Medical History:  Diagnosis Date  . GERD (gastroesophageal reflux disease)   . Hypothyroidism   . Lumbar vertebral fracture (HCC)    L3    Surgical History: Past Surgical History:  Procedure Laterality Date  . ADENOIDECTOMY    . COLONOSCOPY N/A 09/22/2012   Procedure: COLONOSCOPY;  Surgeon: Dalia Heading, MD;  [facility];  Service: Gastroenterology;  Laterality: N/A;  . ESOPHAGOGASTRODUODENOSCOPY (EGD) WITH  ESOPHAGEAL DILATION N/A 09/22/2012   Procedure: ESOPHAGOGASTRODUODENOSCOPY (EGD) WITH POSSIBLE ESOPHAGEAL DILATION;  Surgeon: Dalia Heading, MD;  Location: [facility];  Service: Gastroenterology;  Laterality: N/A;  . THUMB AMPUTATION Left 2018  . TONSILLECTOMY      Home Medications:  Allergies as of 01/05/2020      Reactions   Latex Rash      Medication List       Accurate as of January 05, 2020  3:20 PM. If you have any questions, ask your nurse or doctor.        aspirin EC 81 MG tablet Take 81 mg by mouth daily.   Glucosamine Sulfate 500 MG Tabs Take by mouth.   glucosamine-chondroitin 500-400 MG tablet Take 1 tablet by mouth 3 (three) times daily.   levothyroxine 75 MCG tablet Commonly known as: SYNTHROID Take 75 mcg daily before breakfast by mouth.   multivitamin with minerals Tabs tablet Take 1 tablet by mouth every other day.   omeprazole 40 MG capsule Commonly known as: PRILOSEC Take by mouth.   omeprazole 40 MG capsule Commonly known as: PriLOSEC Take 1 capsule (40 mg total) by mouth daily.   pantoprazole 20 MG tablet Commonly known as: PROTONIX Take by mouth.   Spiriva Respimat 2.5 MCG/ACT Aers Generic drug: Tiotropium Bromide Monohydrate SMARTSIG:2 Puff(s) Via Inhaler Daily       Allergies:  Allergies  Allergen Reactions  . Latex Rash    Family History: Family History  Problem Relation Age of Onset  . Diabetes Mellitus II Mother   . Cerebral aneurysm Father     Social History:  reports that he has quit smoking. His smoking use included cigarettes. He has never used smokeless tobacco. He reports current alcohol use. He reports that he does not use drugs.  ROS: All other review of systems were reviewed and are negative except what is noted above in HPI  Physical Exam: There were no vitals taken for this visit.  Constitutional:  Alert and oriented, No acute distress. HEENT: [place] AT, moist mucus membranes.  Trachea midline, no  masses. Cardiovascular: No clubbing, cyanosis, or edema. Respiratory: Normal respiratory effort, no increased work of breathing. GI: Abdomen is soft, nontender, nondistended, no abdominal masses GU: No CVA tenderness. Circumcised phallus. No masses/lesions on penis, testis, scrotum. Prostate 40g smooth no nodules no induration.  Lymph: No cervical or inguinal lymphadenopathy. Skin: No rashes, bruises or suspicious lesions. Neurologic: Grossly intact, no focal deficits, moving all 4 extremities. Psychiatric: Normal mood and affect.  Laboratory Data: Lab Results  Component Value Date   HGB 14.6 02/25/2008   HCT 43.0 02/25/2008    Lab Results  Component Value Date   CREATININE  0.90 05/16/2017    No results found for: PSA  No results found for: TESTOSTERONE  No results found for: HGBA1C  Urinalysis    Component Value Date/Time   APPEARANCEUR Clear 01/05/2020 1449   GLUCOSEU Negative 01/05/2020 1449   BILIRUBINUR Negative 01/05/2020 1449   PROTEINUR Negative 01/05/2020 1449   NITRITE Negative 01/05/2020 1449   LEUKOCYTESUR Negative 01/05/2020 1449    Lab Results  Component Value Date   LABMICR Comment 01/05/2020    Pertinent Imaging:  No results found for this or any previous visit.  No results found for this or any previous visit.  No results found for this or any previous visit.  No results found for this or any previous visit.  No results found for this or any previous visit.  No results found for this or any previous visit.  No results found for this or any previous visit.  No results found for this or any previous visit.   Assessment & Plan:    1. Elevated PSA -RTC 1 year with PSA - Urinalysis, Routine w reflex microscopic  2. Benign localized prostatic hyperplasia with lower urinary tract symptoms (LUTS) -We will start uroxatral 10mg    3. Nocturia -We will start uroxatral 10mg  qhs   No follow-ups on file.  , MD  [facility] [place]

## 2020-01-05 NOTE — Progress Notes (Signed)
Urological Symptom Review  Patient is experiencing the following symptoms: Frequent urination Trouble starting stream Weak stream Erection problems (male only) Penile pain (male only)    Review of Systems  Gastrointestinal (upper)  : Negative for upper GI symptoms  Gastrointestinal (lower) : Negative for lower GI symptoms  Constitutional : Negative for symptoms  Skin: Negative for skin symptoms  Eyes: Negative for eye symptoms  Ear/Nose/Throat : Negative for Ear/Nose/Throat symptoms  Hematologic/Lymphatic: Negative for Hematologic/Lymphatic symptoms  Cardiovascular : Negative for cardiovascular symptoms  Respiratory : Shortness of breath  Endocrine: Negative for endocrine symptoms  Musculoskeletal: Negative for musculoskeletal symptoms  Neurological: Negative for neurological symptoms  Psychologic: Negative for psychiatric symptoms

## 2020-01-05 NOTE — Patient Instructions (Signed)

## 2020-01-11 NOTE — Progress Notes (Signed)
Results mailed 

## 2020-01-22 DIAGNOSIS — I10 Essential (primary) hypertension: Secondary | ICD-10-CM | POA: Diagnosis not present

## 2020-01-22 DIAGNOSIS — J449 Chronic obstructive pulmonary disease, unspecified: Secondary | ICD-10-CM | POA: Diagnosis not present

## 2020-01-22 DIAGNOSIS — E039 Hypothyroidism, unspecified: Secondary | ICD-10-CM | POA: Diagnosis not present

## 2020-01-22 DIAGNOSIS — Z87891 Personal history of nicotine dependence: Secondary | ICD-10-CM | POA: Diagnosis not present

## 2020-03-31 DIAGNOSIS — Z6830 Body mass index (BMI) 30.0-30.9, adult: Secondary | ICD-10-CM | POA: Diagnosis not present

## 2020-03-31 DIAGNOSIS — J449 Chronic obstructive pulmonary disease, unspecified: Secondary | ICD-10-CM | POA: Diagnosis not present

## 2020-03-31 DIAGNOSIS — Z1389 Encounter for screening for other disorder: Secondary | ICD-10-CM | POA: Diagnosis not present

## 2020-03-31 DIAGNOSIS — Z1331 Encounter for screening for depression: Secondary | ICD-10-CM | POA: Diagnosis not present

## 2020-03-31 DIAGNOSIS — R002 Palpitations: Secondary | ICD-10-CM | POA: Diagnosis not present

## 2020-03-31 DIAGNOSIS — E039 Hypothyroidism, unspecified: Secondary | ICD-10-CM | POA: Diagnosis not present

## 2020-03-31 DIAGNOSIS — E7849 Other hyperlipidemia: Secondary | ICD-10-CM | POA: Diagnosis not present

## 2020-04-22 DIAGNOSIS — Z87891 Personal history of nicotine dependence: Secondary | ICD-10-CM | POA: Diagnosis not present

## 2020-04-22 DIAGNOSIS — J449 Chronic obstructive pulmonary disease, unspecified: Secondary | ICD-10-CM | POA: Diagnosis not present

## 2020-04-22 DIAGNOSIS — I10 Essential (primary) hypertension: Secondary | ICD-10-CM | POA: Diagnosis not present

## 2020-04-22 DIAGNOSIS — E039 Hypothyroidism, unspecified: Secondary | ICD-10-CM | POA: Diagnosis not present

## 2020-05-17 ENCOUNTER — Ambulatory Visit: Payer: PPO | Admitting: Cardiology

## 2020-06-23 ENCOUNTER — Encounter: Payer: Self-pay | Admitting: Urology

## 2020-06-23 ENCOUNTER — Ambulatory Visit (INDEPENDENT_AMBULATORY_CARE_PROVIDER_SITE_OTHER): Payer: PPO | Admitting: Urology

## 2020-06-23 ENCOUNTER — Other Ambulatory Visit: Payer: Self-pay

## 2020-06-23 VITALS — BP 171/80 | HR 90 | Temp 97.9°F | Resp 16 | Ht 72.0 in | Wt 214.0 lb

## 2020-06-23 DIAGNOSIS — N401 Enlarged prostate with lower urinary tract symptoms: Secondary | ICD-10-CM

## 2020-06-23 DIAGNOSIS — N5201 Erectile dysfunction due to arterial insufficiency: Secondary | ICD-10-CM | POA: Diagnosis not present

## 2020-06-23 DIAGNOSIS — N411 Chronic prostatitis: Secondary | ICD-10-CM | POA: Diagnosis not present

## 2020-06-23 DIAGNOSIS — R351 Nocturia: Secondary | ICD-10-CM

## 2020-06-23 LAB — MICROSCOPIC EXAMINATION: Bacteria, UA: NONE SEEN

## 2020-06-23 LAB — URINALYSIS, ROUTINE W REFLEX MICROSCOPIC
Bilirubin, UA: NEGATIVE
Glucose, UA: NEGATIVE
Ketones, UA: NEGATIVE
Leukocytes,UA: NEGATIVE
Nitrite, UA: NEGATIVE
Protein,UA: NEGATIVE
Specific Gravity, UA: 1.03 (ref 1.005–1.030)
Urobilinogen, Ur: 0.2 mg/dL (ref 0.2–1.0)
pH, UA: 5.5 (ref 5.0–7.5)

## 2020-06-23 LAB — BLADDER SCAN AMB NON-IMAGING: Scan Result: 24

## 2020-06-23 MED ORDER — SULFAMETHOXAZOLE-TRIMETHOPRIM 800-160 MG PO TABS: 1.0000 | ORAL_TABLET | Freq: Two times a day (BID) | ORAL | 0 refills | Status: DC

## 2020-06-23 MED ORDER — SILDENAFIL CITRATE 20 MG PO TABS: 20.0000 mg | ORAL_TABLET | ORAL | 11 refills | Status: DC | PRN

## 2020-06-23 MED ORDER — ALFUZOSIN HCL ER 10 MG PO TB24: 10.0000 mg | ORAL_TABLET | Freq: Two times a day (BID) | ORAL | 11 refills | Status: DC

## 2020-06-23 NOTE — Progress Notes (Signed)
Bladder Scan Patient can void: 24 ml Performed By: Bridgette Habermann, lpn

## 2020-06-23 NOTE — Progress Notes (Signed)
06/23/2020 3:17 PM   Lucas Leach December 30, 1950 [id number]  Referring provider: Assunta Found, MD [address] [place],  [place] [address]  Chief Complaint  Patient presents with  . Medication Consultation    Would like to increase Uroxatral    HPI: [name] is a 70yo here for evaluation of new dysuria and urinary urgency. IPSS 22 QOL 3. Over the last 2-3 weeks he has noted a decrease in his urinary stream, worsening urinary urgency and increased urinary frequency. He has associated dysuria. No other associated symptoms. PVR 24cc.  He uses sildenafil 20mg  prn for his erectile dysfunction which works well.   PMH: Past Medical History:  Diagnosis Date  . GERD (gastroesophageal reflux disease)   . Hypothyroidism   . Lumbar vertebral fracture (HCC)    L3    Surgical History: Past Surgical History:  Procedure Laterality Date  . ADENOIDECTOMY    . COLONOSCOPY N/A 09/22/2012   Procedure: COLONOSCOPY;  Surgeon: 11/23/2012, MD;  [facility];  Service: Gastroenterology;  Laterality: N/A;  . ESOPHAGOGASTRODUODENOSCOPY (EGD) WITH ESOPHAGEAL DILATION N/A 09/22/2012   Procedure: ESOPHAGOGASTRODUODENOSCOPY (EGD) WITH POSSIBLE ESOPHAGEAL DILATION;  Surgeon: 11/23/2012, MD;  [facility];  Service: Gastroenterology;  Laterality: N/A;  . THUMB AMPUTATION Left 2018  . TONSILLECTOMY      Home Medications:  Allergies as of 06/23/2020      Reactions   Latex Rash      Medication List       Accurate as of June 23, 2020  3:17 PM. If you have any questions, ask your nurse or doctor.        alfuzosin 10 MG 24 hr tablet Commonly known as: UROXATRAL Take 1 tablet (10 mg total) by mouth daily with breakfast.   aspirin EC 81 MG tablet Take 81 mg by mouth daily.   clotrimazole-betamethasone cream Commonly known as: Lotrisone Apply 1 application topically 2 (two) times daily.   Glucosamine Sulfate 500 MG Tabs Take by mouth.   glucosamine-chondroitin  500-400 MG tablet Take 1 tablet by mouth 3 (three) times daily.   levothyroxine 75 MCG tablet Commonly known as: SYNTHROID Take 75 mcg daily before breakfast by mouth.   multivitamin with minerals Tabs tablet Take 1 tablet by mouth every other day.   omeprazole 40 MG capsule Commonly known as: PRILOSEC Take by mouth.   omeprazole 40 MG capsule Commonly known as: PriLOSEC Take 1 capsule (40 mg total) by mouth daily.   pantoprazole 20 MG tablet Commonly known as: PROTONIX Take by mouth.   Spiriva Respimat 2.5 MCG/ACT Aers Generic drug: Tiotropium Bromide Monohydrate SMARTSIG:2 Puff(s) Via Inhaler Daily       Allergies:  Allergies  Allergen Reactions  . Latex Rash    Family History: Family History  Problem Relation Age of Onset  . Diabetes Mellitus II Mother   . Cerebral aneurysm Father     Social History:  reports that he has quit smoking. His smoking use included cigarettes. He has never used smokeless tobacco. He reports current alcohol use. He reports that he does not use drugs.  ROS: All other review of systems were reviewed and are negative except what is noted above in HPI  Physical Exam: BP (!) 171/80   Pulse 90   Temp 97.9 F (36.6 C)   Resp 16   Ht 6' (1.829 m)   Wt 214 lb (97.1 kg)   BMI 29.02 kg/m   Constitutional:  Alert and  oriented, No acute distress. HEENT: [place] AT, moist mucus membranes.  Trachea midline, no masses. Cardiovascular: No clubbing, cyanosis, or edema. Respiratory: Normal respiratory effort, no increased work of breathing. GI: Abdomen is soft, nontender, nondistended, no abdominal masses GU: No CVA tenderness. Circumcised phallus. No masses/lesions on penis, testis, scrotum. Prostate 40g smooth no nodules no induration.  Lymph: No cervical or inguinal lymphadenopathy. Skin: No rashes, bruises or suspicious lesions. Neurologic: Grossly intact, no focal deficits, moving all 4 extremities. Psychiatric: Normal mood and  affect.  Laboratory Data: Lab Results  Component Value Date   HGB 14.6 02/25/2008   HCT 43.0 02/25/2008    Lab Results  Component Value Date   CREATININE 0.90 05/16/2017    No results found for: PSA  No results found for: TESTOSTERONE  No results found for: HGBA1C  Urinalysis    Component Value Date/Time   APPEARANCEUR Clear 01/05/2020 1449   GLUCOSEU Negative 01/05/2020 1449   BILIRUBINUR Negative 01/05/2020 1449   PROTEINUR Negative 01/05/2020 1449   NITRITE Negative 01/05/2020 1449   LEUKOCYTESUR Negative 01/05/2020 1449    Lab Results  Component Value Date   LABMICR Comment 01/05/2020    Pertinent Imaging:  No results found for this or any previous visit.  No results found for this or any previous visit.  No results found for this or any previous visit.  No results found for this or any previous visit.  No results found for this or any previous visit.  No results found for this or any previous visit.  No results found for this or any previous visit.  No results found for this or any previous visit.   Assessment & Plan:    1. Benign localized prostatic hyperplasia with lower urinary tract symptoms (LUTS) -Increase uroxatral to 10mg  BID  2. Nocturia -Increase uroxatral to 10mg  BID  3. Erectile dysfunction -sildenafil 20mg  prn   No follow-ups on file.  , MD  [facility] [place]

## 2020-06-23 NOTE — Progress Notes (Signed)
Urological Symptom Review  Patient is experiencing the following symptoms: Burning/pain with urination Trouble starting stream Weak stream Erection problems (male only)   Review of Systems  Gastrointestinal (upper)  : Indigestion/heartburn  Gastrointestinal (lower) : Negative for lower GI symptoms  Constitutional : Negative for symptoms  Skin: Negative for skin symptoms  Eyes: Blurred vision  Ear/Nose/Throat : Sinus problems  Hematologic/Lymphatic: Negative for Hematologic/Lymphatic symptoms  Cardiovascular : Negative for cardiovascular symptoms  Respiratory : Shortness of breath  Endocrine: Negative for endocrine symptoms  Musculoskeletal: Negative for musculoskeletal symptoms  Neurological: Headaches  Psychologic: Negative for psychiatric symptoms

## 2020-06-27 DIAGNOSIS — S51831A Puncture wound without foreign body of right forearm, initial encounter: Secondary | ICD-10-CM | POA: Diagnosis not present

## 2020-06-27 DIAGNOSIS — M19031 Primary osteoarthritis, right wrist: Secondary | ICD-10-CM | POA: Diagnosis not present

## 2020-06-27 DIAGNOSIS — Z23 Encounter for immunization: Secondary | ICD-10-CM | POA: Diagnosis not present

## 2020-06-27 DIAGNOSIS — W450XXA Nail entering through skin, initial encounter: Secondary | ICD-10-CM | POA: Diagnosis not present

## 2020-06-27 DIAGNOSIS — Z87891 Personal history of nicotine dependence: Secondary | ICD-10-CM | POA: Diagnosis not present

## 2020-06-27 DIAGNOSIS — S59911A Unspecified injury of right forearm, initial encounter: Secondary | ICD-10-CM | POA: Diagnosis not present

## 2020-07-01 ENCOUNTER — Encounter: Payer: Self-pay | Admitting: Urology

## 2020-07-01 NOTE — Patient Instructions (Signed)

## 2020-07-13 DIAGNOSIS — E6609 Other obesity due to excess calories: Secondary | ICD-10-CM | POA: Diagnosis not present

## 2020-07-13 DIAGNOSIS — M23200 Derangement of unspecified lateral meniscus due to old tear or injury, right knee: Secondary | ICD-10-CM | POA: Diagnosis not present

## 2020-07-13 DIAGNOSIS — Z6830 Body mass index (BMI) 30.0-30.9, adult: Secondary | ICD-10-CM | POA: Diagnosis not present

## 2020-07-22 DIAGNOSIS — Z87891 Personal history of nicotine dependence: Secondary | ICD-10-CM | POA: Diagnosis not present

## 2020-07-22 DIAGNOSIS — E7849 Other hyperlipidemia: Secondary | ICD-10-CM | POA: Diagnosis not present

## 2020-07-22 DIAGNOSIS — I10 Essential (primary) hypertension: Secondary | ICD-10-CM | POA: Diagnosis not present

## 2020-07-22 DIAGNOSIS — J449 Chronic obstructive pulmonary disease, unspecified: Secondary | ICD-10-CM | POA: Diagnosis not present

## 2020-07-22 DIAGNOSIS — E039 Hypothyroidism, unspecified: Secondary | ICD-10-CM | POA: Diagnosis not present

## 2020-08-10 DIAGNOSIS — M25561 Pain in right knee: Secondary | ICD-10-CM | POA: Diagnosis not present

## 2020-08-11 ENCOUNTER — Ambulatory Visit: Payer: PPO | Admitting: Urology

## 2020-08-11 ENCOUNTER — Other Ambulatory Visit: Payer: Self-pay

## 2020-08-11 VITALS — BP 141/70 | HR 77

## 2020-08-11 DIAGNOSIS — N401 Enlarged prostate with lower urinary tract symptoms: Secondary | ICD-10-CM | POA: Diagnosis not present

## 2020-08-11 DIAGNOSIS — R351 Nocturia: Secondary | ICD-10-CM | POA: Diagnosis not present

## 2020-08-11 DIAGNOSIS — N5201 Erectile dysfunction due to arterial insufficiency: Secondary | ICD-10-CM

## 2020-08-11 LAB — URINALYSIS, ROUTINE W REFLEX MICROSCOPIC
Bilirubin, UA: NEGATIVE
Glucose, UA: NEGATIVE
Ketones, UA: NEGATIVE
Leukocytes,UA: NEGATIVE
Nitrite, UA: NEGATIVE
Protein,UA: NEGATIVE
RBC, UA: NEGATIVE
Specific Gravity, UA: 1.015 (ref 1.005–1.030)
Urobilinogen, Ur: 0.2 mg/dL (ref 0.2–1.0)
pH, UA: 5.5 (ref 5.0–7.5)

## 2020-08-11 MED ORDER — ALFUZOSIN HCL ER 10 MG PO TB24
10.0000 mg | ORAL_TABLET | Freq: Two times a day (BID) | ORAL | 11 refills | Status: DC
Start: 2020-08-11 — End: 2021-01-05

## 2020-08-11 NOTE — Progress Notes (Signed)
Urological Symptom Review  Patient is experiencing the following symptoms: Frequent urination Get up at night to urinate Trouble starting stream Weak stream Erection problems (male only)   Review of Systems  Gastrointestinal (upper)  : Negative for upper GI symptoms  Gastrointestinal (lower) : Negative for lower GI symptoms  Constitutional : Negative for symptoms  Skin: Negative for skin symptoms  Eyes: Blurred vision  Ear/Nose/Throat : Sinus problems  Hematologic/Lymphatic: Easy bruising  Cardiovascular : Chest pain  Respiratory : Shortness of breath  Endocrine: Negative for endocrine symptoms  Musculoskeletal: Negative for musculoskeletal symptoms  Neurological: Negative for neurological symptoms  Psychologic: Negative for psychiatric symptoms

## 2020-08-11 NOTE — Progress Notes (Signed)
08/11/2020 2:34 PM   Lucas Leach Mar 30, 1950 [id number]  Referring provider: Assunta Found, MD [address] [place],  [place] [address]  Followup BPH  HPI: [name] is a 70yo here for followup BPH . IPSS 15 QOL 3. He is currently on uroxatral 10mg  BID. He is not bothered enough by his LUTS to change therapy or consider surgical interventions for BPH. No dysuria or hematuria. No hx of UTI. No other complaints today   PMH: Past Medical History:  Diagnosis Date  . GERD (gastroesophageal reflux disease)   . Hypothyroidism   . Lumbar vertebral fracture (HCC)    L3    Surgical History: Past Surgical History:  Procedure Laterality Date  . ADENOIDECTOMY    . COLONOSCOPY N/A 09/22/2012   Procedure: COLONOSCOPY;  Surgeon: 11/23/2012, MD;  [facility];  Service: Gastroenterology;  Laterality: N/A;  . ESOPHAGOGASTRODUODENOSCOPY (EGD) WITH ESOPHAGEAL DILATION N/A 09/22/2012   Procedure: ESOPHAGOGASTRODUODENOSCOPY (EGD) WITH POSSIBLE ESOPHAGEAL DILATION;  Surgeon: 11/23/2012, MD;  [facility];  Service: Gastroenterology;  Laterality: N/A;  . THUMB AMPUTATION Left 2018  . TONSILLECTOMY      Home Medications:  Allergies as of 08/11/2020      Reactions   Latex Rash      Medication List       Accurate as of Aug 11, 2020  2:34 PM. If you have any questions, ask your nurse or doctor.        alfuzosin 10 MG 24 hr tablet Commonly known as: UROXATRAL Take 1 tablet (10 mg total) by mouth in the morning and at bedtime.   aspirin EC 81 MG tablet Take 81 mg by mouth daily.   clotrimazole-betamethasone cream Commonly known as: Lotrisone Apply 1 application topically 2 (two) times daily.   Glucosamine Sulfate 500 MG Tabs Take by mouth.   glucosamine-chondroitin 500-400 MG tablet Take 1 tablet by mouth 3 (three) times daily.   levothyroxine 75 MCG tablet Commonly known as: SYNTHROID Take 75 mcg daily before breakfast by mouth.   multivitamin  with minerals Tabs tablet Take 1 tablet by mouth every other day.   omeprazole 40 MG capsule Commonly known as: PRILOSEC Take by mouth.   omeprazole 40 MG capsule Commonly known as: PriLOSEC Take 1 capsule (40 mg total) by mouth daily.   pantoprazole 20 MG tablet Commonly known as: PROTONIX Take by mouth.   rosuvastatin 10 MG tablet Commonly known as: CRESTOR Take 10 mg by mouth at bedtime.   sildenafil 20 MG tablet Commonly known as: REVATIO Take 1 tablet (20 mg total) by mouth as needed.   Spiriva Respimat 2.5 MCG/ACT Aers Generic drug: Tiotropium Bromide Monohydrate SMARTSIG:2 Puff(s) Via Inhaler Daily   sulfamethoxazole-trimethoprim 800-160 MG tablet Commonly known as: BACTRIM DS Take 1 tablet by mouth every 12 (twelve) hours.   tadalafil 5 MG tablet Commonly known as: CIALIS SMARTSIG:1-4 Tablet(s) By Mouth PRN       Allergies:  Allergies  Allergen Reactions  . Latex Rash    Family History: Family History  Problem Relation Age of Onset  . Diabetes Mellitus II Mother   . Cerebral aneurysm Father     Social History:  reports that he has quit smoking. His smoking use included cigarettes. He has never used smokeless tobacco. He reports current alcohol use. He reports that he does not use drugs.  ROS: All other review of systems were reviewed and are negative except what is noted above in HPI  Physical Exam: BP (!) 141/70   Pulse 77   Constitutional:  Alert and oriented, No acute distress. HEENT: [place] AT, moist mucus membranes.  Trachea midline, no masses. Cardiovascular: No clubbing, cyanosis, or edema. Respiratory: Normal respiratory effort, no increased work of breathing. GI: Abdomen is soft, nontender, nondistended, no abdominal masses GU: No CVA tenderness.  Lymph: No cervical or inguinal lymphadenopathy. Skin: No rashes, bruises or suspicious lesions. Neurologic: Grossly intact, no focal deficits, moving all 4 extremities. Psychiatric: Normal  mood and affect.  Laboratory Data: Lab Results  Component Value Date   HGB 14.6 02/25/2008   HCT 43.0 02/25/2008    Lab Results  Component Value Date   CREATININE 0.90 05/16/2017    No results found for: PSA  No results found for: TESTOSTERONE  No results found for: HGBA1C  Urinalysis    Component Value Date/Time   APPEARANCEUR Clear 06/23/2020 1534   GLUCOSEU Negative 06/23/2020 1534   BILIRUBINUR Negative 06/23/2020 1534   PROTEINUR Negative 06/23/2020 1534   NITRITE Negative 06/23/2020 1534   LEUKOCYTESUR Negative 06/23/2020 1534    Lab Results  Component Value Date   LABMICR See below: 06/23/2020   WBCUA 0-5 06/23/2020   LABEPIT 0-10 06/23/2020   BACTERIA None seen 06/23/2020    Pertinent Imaging:  No results found for this or any previous visit.  No results found for this or any previous visit.  No results found for this or any previous visit.  No results found for this or any previous visit.  No results found for this or any previous visit.  No results found for this or any previous visit.  No results found for this or any previous visit.  No results found for this or any previous visit.   Assessment & Plan:    1. Benign localized prostatic hyperplasia with lower urinary tract symptoms (LUTS) -Continue uroxatral 10mg  BID - Urinalysis, Routine w reflex microscopic  2. Nocturia Continue uroxatral 10mg  BID   No follow-ups on file.  , MD  [facility] [place]

## 2020-08-22 ENCOUNTER — Encounter: Payer: Self-pay | Admitting: Urology

## 2020-08-22 NOTE — Patient Instructions (Signed)

## 2020-09-05 DIAGNOSIS — M25561 Pain in right knee: Secondary | ICD-10-CM | POA: Diagnosis not present

## 2020-09-06 DIAGNOSIS — B352 Tinea manuum: Secondary | ICD-10-CM | POA: Diagnosis not present

## 2020-09-06 DIAGNOSIS — B351 Tinea unguium: Secondary | ICD-10-CM | POA: Diagnosis not present

## 2020-09-06 DIAGNOSIS — B353 Tinea pedis: Secondary | ICD-10-CM | POA: Diagnosis not present

## 2020-09-09 DIAGNOSIS — M25561 Pain in right knee: Secondary | ICD-10-CM | POA: Diagnosis not present

## 2020-09-14 DIAGNOSIS — M25561 Pain in right knee: Secondary | ICD-10-CM | POA: Diagnosis not present

## 2020-10-23 DIAGNOSIS — Y999 Unspecified external cause status: Secondary | ICD-10-CM | POA: Diagnosis not present

## 2020-10-23 DIAGNOSIS — S83271A Complex tear of lateral meniscus, current injury, right knee, initial encounter: Secondary | ICD-10-CM | POA: Diagnosis not present

## 2020-10-23 DIAGNOSIS — M94261 Chondromalacia, right knee: Secondary | ICD-10-CM | POA: Diagnosis not present

## 2020-10-23 DIAGNOSIS — G8918 Other acute postprocedural pain: Secondary | ICD-10-CM | POA: Diagnosis not present

## 2020-10-23 DIAGNOSIS — X58XXXA Exposure to other specified factors, initial encounter: Secondary | ICD-10-CM | POA: Diagnosis not present

## 2020-10-23 DIAGNOSIS — S83231A Complex tear of medial meniscus, current injury, right knee, initial encounter: Secondary | ICD-10-CM | POA: Diagnosis not present

## 2020-10-23 DIAGNOSIS — S83281A Other tear of lateral meniscus, current injury, right knee, initial encounter: Secondary | ICD-10-CM | POA: Diagnosis not present

## 2020-10-23 DIAGNOSIS — S83241A Other tear of medial meniscus, current injury, right knee, initial encounter: Secondary | ICD-10-CM | POA: Diagnosis not present

## 2020-10-31 DIAGNOSIS — Z9889 Other specified postprocedural states: Secondary | ICD-10-CM | POA: Diagnosis not present

## 2020-11-07 DIAGNOSIS — M25461 Effusion, right knee: Secondary | ICD-10-CM | POA: Diagnosis not present

## 2020-11-07 DIAGNOSIS — Z9889 Other specified postprocedural states: Secondary | ICD-10-CM | POA: Diagnosis not present

## 2020-12-27 ENCOUNTER — Other Ambulatory Visit: Payer: Self-pay

## 2020-12-27 ENCOUNTER — Other Ambulatory Visit: Payer: PPO

## 2020-12-27 DIAGNOSIS — R972 Elevated prostate specific antigen [PSA]: Secondary | ICD-10-CM

## 2020-12-27 DIAGNOSIS — Z0001 Encounter for general adult medical examination with abnormal findings: Secondary | ICD-10-CM | POA: Diagnosis not present

## 2020-12-27 DIAGNOSIS — J069 Acute upper respiratory infection, unspecified: Secondary | ICD-10-CM | POA: Diagnosis not present

## 2020-12-27 DIAGNOSIS — E782 Mixed hyperlipidemia: Secondary | ICD-10-CM | POA: Diagnosis not present

## 2020-12-27 DIAGNOSIS — A084 Viral intestinal infection, unspecified: Secondary | ICD-10-CM | POA: Diagnosis not present

## 2020-12-27 DIAGNOSIS — E6609 Other obesity due to excess calories: Secondary | ICD-10-CM | POA: Diagnosis not present

## 2020-12-27 DIAGNOSIS — R7309 Other abnormal glucose: Secondary | ICD-10-CM | POA: Diagnosis not present

## 2020-12-27 DIAGNOSIS — Z1331 Encounter for screening for depression: Secondary | ICD-10-CM | POA: Diagnosis not present

## 2020-12-27 DIAGNOSIS — Z23 Encounter for immunization: Secondary | ICD-10-CM | POA: Diagnosis not present

## 2020-12-27 DIAGNOSIS — E039 Hypothyroidism, unspecified: Secondary | ICD-10-CM | POA: Diagnosis not present

## 2020-12-28 ENCOUNTER — Other Ambulatory Visit: Payer: PPO

## 2020-12-28 LAB — PSA: Prostate Specific Ag, Serum: 2.2 ng/mL (ref 0.0–4.0)

## 2020-12-29 ENCOUNTER — Other Ambulatory Visit: Payer: PPO

## 2021-01-05 ENCOUNTER — Ambulatory Visit (INDEPENDENT_AMBULATORY_CARE_PROVIDER_SITE_OTHER): Payer: PPO | Admitting: Urology

## 2021-01-05 ENCOUNTER — Other Ambulatory Visit: Payer: Self-pay

## 2021-01-05 DIAGNOSIS — R351 Nocturia: Secondary | ICD-10-CM | POA: Diagnosis not present

## 2021-01-05 DIAGNOSIS — N401 Enlarged prostate with lower urinary tract symptoms: Secondary | ICD-10-CM

## 2021-01-05 DIAGNOSIS — N5201 Erectile dysfunction due to arterial insufficiency: Secondary | ICD-10-CM | POA: Diagnosis not present

## 2021-01-05 DIAGNOSIS — R972 Elevated prostate specific antigen [PSA]: Secondary | ICD-10-CM | POA: Diagnosis not present

## 2021-01-05 LAB — URINALYSIS, ROUTINE W REFLEX MICROSCOPIC
Bilirubin, UA: NEGATIVE
Glucose, UA: NEGATIVE
Ketones, UA: NEGATIVE
Leukocytes,UA: NEGATIVE
Nitrite, UA: NEGATIVE
Protein,UA: NEGATIVE
RBC, UA: NEGATIVE
Specific Gravity, UA: 1.025 (ref 1.005–1.030)
Urobilinogen, Ur: 0.2 mg/dL (ref 0.2–1.0)
pH, UA: 5 (ref 5.0–7.5)

## 2021-01-05 MED ORDER — SILDENAFIL CITRATE 20 MG PO TABS: 20.0000 mg | ORAL_TABLET | ORAL | 11 refills | Status: DC | PRN

## 2021-01-05 MED ORDER — ALFUZOSIN HCL ER 10 MG PO TB24: 10.0000 mg | ORAL_TABLET | Freq: Two times a day (BID) | ORAL | 11 refills | Status: DC

## 2021-01-05 NOTE — Progress Notes (Signed)
Urological Symptom Review  Patient is experiencing the following symptoms: Get up at night to urinate Weak stream Erection problems (male only)   Review of Systems  Gastrointestinal (upper)  : Nausea  Gastrointestinal (lower) : Diarrhea  Constitutional : Negative for symptoms  Skin: Negative for skin symptoms  Eyes: Negative for eye symptoms  Ear/Nose/Throat : Negative for Ear/Nose/Throat symptoms  Hematologic/Lymphatic: Negative for Hematologic/Lymphatic symptoms  Cardiovascular : Negative for cardiovascular symptoms  Respiratory : Shortness of breath  Endocrine: Negative for endocrine symptoms  Musculoskeletal: Negative for musculoskeletal symptoms  Neurological: Negative for neurological symptoms  Psychologic: Negative for psychiatric symptoms

## 2021-01-05 NOTE — Progress Notes (Signed)
01/05/2021 11:42 AM   Gardiner Coins [place]vleck 24-Dec-1950 [id number]  Referring provider: Assunta Found, MD [address] [place],  [place] [address]  Followup BPH and elevated PSA   HPI: [name] is a 70yo here for followup for elevated PSA and BPH with nocturia. PSA decreased to 2.2 from 3.2. He has mild LUTS on uroxatral 10mg  qhs. IPSS 5 QOL 0. Urine stream strong. Nocturia 0-2x. No urinary frequency or urgency. He uses sildeanfil 20mg  PRN for his erections which works well for him. No other complaints today   PMH: Past Medical History:  Diagnosis Date   GERD (gastroesophageal reflux disease)    Hypothyroidism    Lumbar vertebral fracture (HCC)    L3    Surgical History: Past Surgical History:  Procedure Laterality Date   ADENOIDECTOMY     COLONOSCOPY N/A 09/22/2012   Procedure: COLONOSCOPY;  Surgeon: , MD;  [facility];  Service: Gastroenterology;  Laterality: N/A;   ESOPHAGOGASTRODUODENOSCOPY (EGD) WITH ESOPHAGEAL DILATION N/A 09/22/2012   Procedure: ESOPHAGOGASTRODUODENOSCOPY (EGD) WITH POSSIBLE ESOPHAGEAL DILATION;  Surgeon: Dalia Heading, MD;  [facility];  Service: Gastroenterology;  Laterality: N/A;   THUMB AMPUTATION Left 2018   TONSILLECTOMY      Home Medications:  Allergies as of 01/05/2021       Reactions   Latex Rash        Medication List        Accurate as of January 05, 2021 11:42 AM. If you have any questions, ask your nurse or doctor.          STOP taking these medications    tadalafil 5 MG tablet Commonly known as: CIALIS Stopped by: 01/07/2021, MD       TAKE these medications    alfuzosin 10 MG 24 hr tablet Commonly known as: UROXATRAL Take 1 tablet (10 mg total) by mouth in the morning and at bedtime.   aspirin EC 81 MG tablet Take 81 mg by mouth daily.   clotrimazole-betamethasone cream Commonly known as: Lotrisone Apply 1 application topically 2 (two) times daily.   Glucosamine  Sulfate 500 MG Tabs Take by mouth.   glucosamine-chondroitin 500-400 MG tablet Take 1 tablet by mouth 3 (three) times daily.   levothyroxine 75 MCG tablet Commonly known as: SYNTHROID Take 75 mcg daily before breakfast by mouth.   multivitamin with minerals Tabs tablet Take 1 tablet by mouth every other day.   omeprazole 40 MG capsule Commonly known as: PRILOSEC Take by mouth.   omeprazole 40 MG capsule Commonly known as: PriLOSEC Take 1 capsule (40 mg total) by mouth daily.   ondansetron 4 MG tablet Commonly known as: ZOFRAN Take 4-8 mg by mouth every 6 (six) hours as needed.   pantoprazole 20 MG tablet Commonly known as: PROTONIX Take by mouth.   rosuvastatin 10 MG tablet Commonly known as: CRESTOR Take 10 mg by mouth at bedtime.   sildenafil 20 MG tablet Commonly known as: REVATIO Take 1 tablet (20 mg total) by mouth as needed.   Spiriva Respimat 2.5 MCG/ACT Aers Generic drug: Tiotropium Bromide Monohydrate SMARTSIG:2 Puff(s) Via Inhaler Daily   sulfamethoxazole-trimethoprim 800-160 MG tablet Commonly known as: BACTRIM DS Take 1 tablet by mouth every 12 (twelve) hours.        Allergies:  Allergies  Allergen Reactions   Latex Rash    Family History: Family History  Problem Relation Age of Onset   Diabetes Mellitus II Mother    Cerebral  aneurysm Father     Social History:  reports that he has quit smoking. His smoking use included cigarettes. He has never used smokeless tobacco. He reports current alcohol use. He reports that he does not use drugs.  ROS: All other review of systems were reviewed and are negative except what is noted above in HPI  Physical Exam: There were no vitals taken for this visit.  Constitutional:  Alert and oriented, No acute distress. HEENT: [place] AT, moist mucus membranes.  Trachea midline, no masses. Cardiovascular: No clubbing, cyanosis, or edema. Respiratory: Normal respiratory effort, no increased work of  breathing. GI: Abdomen is soft, nontender, nondistended, no abdominal masses GU: No CVA tenderness.  Lymph: No cervical or inguinal lymphadenopathy. Skin: No rashes, bruises or suspicious lesions. Neurologic: Grossly intact, no focal deficits, moving all 4 extremities. Psychiatric: Normal mood and affect.  Laboratory Data: Lab Results  Component Value Date   HGB 14.6 02/25/2008   HCT 43.0 02/25/2008    Lab Results  Component Value Date   CREATININE 0.90 05/16/2017    No results found for: PSA  No results found for: TESTOSTERONE  No results found for: HGBA1C  Urinalysis    Component Value Date/Time   APPEARANCEUR Clear 08/11/2020 1433   GLUCOSEU Negative 08/11/2020 1433   BILIRUBINUR Negative 08/11/2020 1433   PROTEINUR Negative 08/11/2020 1433   NITRITE Negative 08/11/2020 1433   LEUKOCYTESUR Negative 08/11/2020 1433    Lab Results  Component Value Date   LABMICR Comment 08/11/2020   WBCUA 0-5 06/23/2020   LABEPIT 0-10 06/23/2020   BACTERIA None seen 06/23/2020    Pertinent Imaging:  No results found for this or any previous visit.  No results found for this or any previous visit.  No results found for this or any previous visit.  No results found for this or any previous visit.  No results found for this or any previous visit.  No results found for this or any previous visit.  No results found for this or any previous visit.  No results found for this or any previous visit.   Assessment & Plan:    1. Elevated PSA -RTC 1 year with a PSA - Urinalysis, Routine w reflex microscopic  2. Benign localized prostatic hyperplasia with lower urinary tract symptoms (LUTS) -Uroxatral 10mg  BID  3. Nocturia -Uroxatral 10mg  BID  4. Erectile dysfunction -sildenafil 20mg  prn   No follow-ups on file.  , MD  [facility] [place]

## 2021-01-09 ENCOUNTER — Encounter: Payer: Self-pay | Admitting: Urology

## 2021-01-09 NOTE — Patient Instructions (Signed)
Benign Prostatic Hyperplasia Benign prostatic hyperplasia (BPH) is an enlarged prostate gland that is caused by the normal aging process and not by cancer. The prostate is a walnut-sized gland that is involved in the production of semen. It is located in front of the rectum and below the bladder. The bladder stores urine and the urethra is the tube that carries the urine out of the body. The prostate may get bigger as a man gets older. An enlarged prostate can press on the urethra. This can make it harder to pass urine. The build-up of urine in the bladder can cause infection. Back pressure and infection may progress to bladder damage and kidney (renal) failure. What are the causes? This condition is part of a normal aging process. However, not all men develop problems from this condition. If the prostate enlarges away from the urethra, urine flow will not be blocked. If it enlarges toward the urethra and compresses it, there will be problems passing urine. What increases the risk? This condition is more likely to develop in men over the age of 50 years. What are the signs or symptoms? Symptoms of this condition include: Getting up often during the night to urinate. Needing to urinate frequently during the day. Difficulty starting urine flow. Decrease in size and strength of your urine stream. Leaking (dribbling) after urinating. Inability to pass urine. This needs immediate treatment. Inability to completely empty your bladder. Pain when you pass urine. This is more common if there is also an infection. Urinary tract infection (UTI). How is this diagnosed? This condition is diagnosed based on your medical history, a physical exam, and your symptoms. Tests will also be done, such as: A post-void bladder scan. This measures any amount of urine that may remain in your bladder after you finish urinating. A digital rectal exam. In a rectal exam, your health care provider checks your prostate by  putting a lubricated, gloved finger into your rectum to feel the back of your prostate gland. This exam detects the size of your gland and any abnormal lumps or growths. An exam of your urine (urinalysis). A prostate specific antigen (PSA) screening. This is a blood test used to screen for prostate cancer. An ultrasound. This test uses sound waves to electronically produce a picture of your prostate gland. Your health care provider may refer you to a specialist in kidney and prostate diseases (urologist). How is this treated? Once symptoms begin, your health care provider will monitor your condition (active surveillance or watchful waiting). Treatment for this condition will depend on the severity of your condition. Treatment may include: Observation and yearly exams. This may be the only treatment needed if your condition and symptoms are mild. Medicines to relieve your symptoms, including: Medicines to shrink the prostate. Medicines to relax the muscle of the prostate. Surgery in severe cases. Surgery may include: Prostatectomy. In this procedure, the prostate tissue is removed completely through an open incision or with a laparoscope or robotics. Transurethral resection of the prostate (TURP). In this procedure, a tool is inserted through the opening at the tip of the penis (urethra). It is used to cut away tissue of the inner core of the prostate. The pieces are removed through the same opening of the penis. This removes the blockage. Transurethral incision (TUIP). In this procedure, small cuts are made in the prostate. This lessens the prostate's pressure on the urethra. Transurethral microwave thermotherapy (TUMT). This procedure uses microwaves to create heat. The heat destroys and removes a   small amount of prostate tissue. Transurethral needle ablation (TUNA). This procedure uses radio frequencies to destroy and remove a small amount of prostate tissue. Interstitial laser coagulation (ILC).  This procedure uses a laser to destroy and remove a small amount of prostate tissue. Transurethral electrovaporization (TUVP). This procedure uses electrodes to destroy and remove a small amount of prostate tissue. Prostatic urethral lift. This procedure inserts an implant to push the lobes of the prostate away from the urethra. Follow these instructions at home: Take over-the-counter and prescription medicines only as told by your health care provider. Monitor your symptoms for any changes. Contact your health care provider with any changes. Avoid drinking large amounts of liquid before going to bed or out in public. Avoid or reduce how much caffeine or alcohol you drink. Give yourself time when you urinate. Keep all follow-up visits as told by your health care provider. This is important. Contact a health care provider if: You have unexplained back pain. Your symptoms do not get better with treatment. You develop side effects from the medicine you are taking. Your urine becomes very dark or has a bad smell. Your lower abdomen becomes distended and you have trouble passing your urine. Get help right away if: You have a fever or chills. You suddenly cannot urinate. You feel lightheaded, or very dizzy, or you faint. There are large amounts of blood or clots in the urine. Your urinary problems become hard to manage. You develop moderate to severe low back or flank pain. The flank is the side of your body between the ribs and the hip. These symptoms may represent a serious problem that is an emergency. Do not wait to see if the symptoms will go away. Get medical help right away. Call your local emergency services (911 in the [place] Do not drive yourself to the hospital. Summary Benign prostatic hyperplasia (BPH) is an enlarged prostate that is caused by the normal aging process and not by cancer. An enlarged prostate can press on the urethra. This can make it hard to pass urine. This  condition is part of a normal aging process and is more likely to develop in men over the age of 50 years. Get help right away if you suddenly cannot urinate. This information is not intended to replace advice given to you by your health care provider. Make sure you discuss any questions you have with your health care provider. Document Revised: 06/21/2020 Document Reviewed: 11/18/2019 Elsevier Patient Education  2022 Elsevier Inc.  

## 2021-01-19 DIAGNOSIS — Z6829 Body mass index (BMI) 29.0-29.9, adult: Secondary | ICD-10-CM | POA: Diagnosis not present

## 2021-01-19 DIAGNOSIS — E6609 Other obesity due to excess calories: Secondary | ICD-10-CM | POA: Diagnosis not present

## 2021-01-19 DIAGNOSIS — R197 Diarrhea, unspecified: Secondary | ICD-10-CM | POA: Diagnosis not present

## 2021-02-12 DIAGNOSIS — Z6829 Body mass index (BMI) 29.0-29.9, adult: Secondary | ICD-10-CM | POA: Diagnosis not present

## 2021-02-12 DIAGNOSIS — E6609 Other obesity due to excess calories: Secondary | ICD-10-CM | POA: Diagnosis not present

## 2021-02-12 DIAGNOSIS — R197 Diarrhea, unspecified: Secondary | ICD-10-CM | POA: Diagnosis not present

## 2021-02-21 ENCOUNTER — Other Ambulatory Visit: Payer: Self-pay | Admitting: Gastroenterology

## 2021-02-21 ENCOUNTER — Other Ambulatory Visit (HOSPITAL_BASED_OUTPATIENT_CLINIC_OR_DEPARTMENT_OTHER): Payer: Self-pay | Admitting: Gastroenterology

## 2021-02-21 DIAGNOSIS — R197 Diarrhea, unspecified: Secondary | ICD-10-CM | POA: Diagnosis not present

## 2021-02-21 DIAGNOSIS — R1084 Generalized abdominal pain: Secondary | ICD-10-CM | POA: Diagnosis not present

## 2021-02-21 DIAGNOSIS — R194 Change in bowel habit: Secondary | ICD-10-CM | POA: Diagnosis not present

## 2021-02-27 ENCOUNTER — Ambulatory Visit (HOSPITAL_BASED_OUTPATIENT_CLINIC_OR_DEPARTMENT_OTHER)
Admission: RE | Admit: 2021-02-27 | Discharge: 2021-02-27 | Disposition: A | Discharge status: Home / Self Care | Payer: PPO | Source: Ambulatory Visit | Attending: Gastroenterology | Admitting: Gastroenterology

## 2021-02-27 ENCOUNTER — Other Ambulatory Visit: Payer: Self-pay

## 2021-02-27 ENCOUNTER — Encounter (HOSPITAL_BASED_OUTPATIENT_CLINIC_OR_DEPARTMENT_OTHER): Payer: Self-pay

## 2021-02-27 DIAGNOSIS — R1084 Generalized abdominal pain: Secondary | ICD-10-CM | POA: Insufficient documentation

## 2021-02-27 DIAGNOSIS — I700 Atherosclerosis of aorta: Secondary | ICD-10-CM | POA: Diagnosis not present

## 2021-02-27 DIAGNOSIS — N400 Enlarged prostate without lower urinary tract symptoms: Secondary | ICD-10-CM | POA: Diagnosis not present

## 2021-02-27 LAB — POCT I-STAT CREATININE: Creatinine, Ser: 0.9 mg/dL (ref 0.61–1.24)

## 2021-02-27 MED ORDER — IOHEXOL 300 MG/ML  SOLN
80.0000 mL | Freq: Once | INTRAMUSCULAR | Status: AC | PRN
  Administered 2021-02-27: 80 mL via INTRAVENOUS

## 2021-03-02 DIAGNOSIS — K648 Other hemorrhoids: Secondary | ICD-10-CM | POA: Diagnosis not present

## 2021-03-02 DIAGNOSIS — K52832 Lymphocytic colitis: Secondary | ICD-10-CM | POA: Diagnosis not present

## 2021-03-02 DIAGNOSIS — R194 Change in bowel habit: Secondary | ICD-10-CM | POA: Diagnosis not present

## 2021-03-02 DIAGNOSIS — R1084 Generalized abdominal pain: Secondary | ICD-10-CM | POA: Diagnosis not present

## 2021-03-02 DIAGNOSIS — K635 Polyp of colon: Secondary | ICD-10-CM | POA: Diagnosis not present

## 2021-03-02 DIAGNOSIS — K621 Rectal polyp: Secondary | ICD-10-CM | POA: Diagnosis not present

## 2021-03-02 DIAGNOSIS — R197 Diarrhea, unspecified: Secondary | ICD-10-CM | POA: Diagnosis not present

## 2021-03-06 DIAGNOSIS — K635 Polyp of colon: Secondary | ICD-10-CM | POA: Diagnosis not present

## 2021-03-06 DIAGNOSIS — K52832 Lymphocytic colitis: Secondary | ICD-10-CM | POA: Diagnosis not present

## 2021-03-06 DIAGNOSIS — K621 Rectal polyp: Secondary | ICD-10-CM | POA: Diagnosis not present

## 2021-03-19 DIAGNOSIS — U071 COVID-19: Secondary | ICD-10-CM | POA: Diagnosis not present

## 2021-05-07 DIAGNOSIS — K52832 Lymphocytic colitis: Secondary | ICD-10-CM | POA: Diagnosis not present

## 2021-05-21 DIAGNOSIS — K219 Gastro-esophageal reflux disease without esophagitis: Secondary | ICD-10-CM | POA: Diagnosis not present

## 2021-05-21 DIAGNOSIS — K52832 Lymphocytic colitis: Secondary | ICD-10-CM | POA: Diagnosis not present

## 2021-05-21 DIAGNOSIS — K529 Noninfective gastroenteritis and colitis, unspecified: Secondary | ICD-10-CM | POA: Diagnosis not present

## 2021-05-29 DIAGNOSIS — G4762 Sleep related leg cramps: Secondary | ICD-10-CM | POA: Diagnosis not present

## 2021-05-29 DIAGNOSIS — E663 Overweight: Secondary | ICD-10-CM | POA: Diagnosis not present

## 2021-05-29 DIAGNOSIS — Z6828 Body mass index (BMI) 28.0-28.9, adult: Secondary | ICD-10-CM | POA: Diagnosis not present

## 2021-05-29 DIAGNOSIS — E039 Hypothyroidism, unspecified: Secondary | ICD-10-CM | POA: Diagnosis not present

## 2021-05-29 DIAGNOSIS — R197 Diarrhea, unspecified: Secondary | ICD-10-CM | POA: Diagnosis not present

## 2021-08-09 DIAGNOSIS — Z6829 Body mass index (BMI) 29.0-29.9, adult: Secondary | ICD-10-CM | POA: Diagnosis not present

## 2021-08-09 DIAGNOSIS — E039 Hypothyroidism, unspecified: Secondary | ICD-10-CM | POA: Diagnosis not present

## 2021-08-09 DIAGNOSIS — J329 Chronic sinusitis, unspecified: Secondary | ICD-10-CM | POA: Diagnosis not present

## 2021-08-09 DIAGNOSIS — J441 Chronic obstructive pulmonary disease with (acute) exacerbation: Secondary | ICD-10-CM | POA: Diagnosis not present

## 2021-10-24 DIAGNOSIS — E039 Hypothyroidism, unspecified: Secondary | ICD-10-CM | POA: Diagnosis not present

## 2021-10-24 DIAGNOSIS — E7849 Other hyperlipidemia: Secondary | ICD-10-CM | POA: Diagnosis not present

## 2021-10-24 DIAGNOSIS — J441 Chronic obstructive pulmonary disease with (acute) exacerbation: Secondary | ICD-10-CM | POA: Diagnosis not present

## 2021-10-24 DIAGNOSIS — R7309 Other abnormal glucose: Secondary | ICD-10-CM | POA: Diagnosis not present

## 2021-10-24 DIAGNOSIS — Z0001 Encounter for general adult medical examination with abnormal findings: Secondary | ICD-10-CM | POA: Diagnosis not present

## 2021-10-24 DIAGNOSIS — E663 Overweight: Secondary | ICD-10-CM | POA: Diagnosis not present

## 2021-10-24 DIAGNOSIS — E782 Mixed hyperlipidemia: Secondary | ICD-10-CM | POA: Diagnosis not present

## 2021-10-24 DIAGNOSIS — Z1331 Encounter for screening for depression: Secondary | ICD-10-CM | POA: Diagnosis not present

## 2021-10-24 DIAGNOSIS — Z6829 Body mass index (BMI) 29.0-29.9, adult: Secondary | ICD-10-CM | POA: Diagnosis not present

## 2021-12-31 ENCOUNTER — Other Ambulatory Visit: Payer: PPO

## 2021-12-31 DIAGNOSIS — R972 Elevated prostate specific antigen [PSA]: Secondary | ICD-10-CM

## 2022-01-01 LAB — PSA: Prostate Specific Ag, Serum: 2.6 ng/mL (ref 0.0–4.0)

## 2022-01-02 ENCOUNTER — Telehealth: Payer: Self-pay

## 2022-01-02 MED ORDER — VARDENAFIL HCL 20 MG PO TABS: 20.0000 mg | ORAL_TABLET | Freq: Every day | ORAL | 0 refills | Status: DC | PRN

## 2022-01-02 NOTE — Telephone Encounter (Signed)
Patient states that the sildenafil was causing him to have headaches and feel flushed.  He does not feel that the medication is helping.  He is asking if he can increase the dose to 40mg .  He has tried Cialis in the past and did not like the side effects.  Verbal from Dr. Royann Shivers he does not recommend increasing the dose and to send in [place] 20mg  for the patient to try.  Rx sent to pharmacy and patient informed.

## 2022-01-03 NOTE — Telephone Encounter (Signed)
See below. Please advise.  

## 2022-01-07 ENCOUNTER — Ambulatory Visit: Payer: PPO | Admitting: Urology

## 2022-01-07 ENCOUNTER — Telehealth: Payer: Self-pay

## 2022-01-07 NOTE — Telephone Encounter (Signed)
Patient called with questions regarding the medication that was prescribed to him.  Medication: vardenafil (LEVITRA) 20 MG tablet  Patient advised that the medication costs more thank he can afford and wanted to know if there was another medication that could be called into pharmacy. He advised he would like someone to call him to discuss same.

## 2022-01-08 NOTE — Telephone Encounter (Signed)
Please see below and advise.

## 2022-01-08 NOTE — Progress Notes (Signed)
Letter sent.

## 2022-01-08 NOTE — Telephone Encounter (Signed)
Patient states the pharmacy was going to charge him the price of 30 tablets for 6 and he wanted a full 30 day supply sent in.   Patient has appointment with Dr.tomorrow and wish to wait to make any changes until then.

## 2022-01-09 ENCOUNTER — Encounter: Payer: Self-pay | Admitting: Urology

## 2022-01-09 ENCOUNTER — Ambulatory Visit: Payer: PPO | Admitting: Urology

## 2022-01-09 VITALS — BP 147/74 | HR 80

## 2022-01-09 DIAGNOSIS — R972 Elevated prostate specific antigen [PSA]: Secondary | ICD-10-CM

## 2022-01-09 DIAGNOSIS — N5201 Erectile dysfunction due to arterial insufficiency: Secondary | ICD-10-CM | POA: Diagnosis not present

## 2022-01-09 DIAGNOSIS — N401 Enlarged prostate with lower urinary tract symptoms: Secondary | ICD-10-CM | POA: Diagnosis not present

## 2022-01-09 DIAGNOSIS — R351 Nocturia: Secondary | ICD-10-CM | POA: Diagnosis not present

## 2022-01-09 MED ORDER — VARDENAFIL HCL 20 MG PO TABS: 20.0000 mg | ORAL_TABLET | Freq: Every day | ORAL | 5 refills | Status: DC | PRN

## 2022-01-09 MED ORDER — ALFUZOSIN HCL ER 10 MG PO TB24: 10.0000 mg | ORAL_TABLET | Freq: Two times a day (BID) | ORAL | 11 refills | Status: DC

## 2022-01-09 NOTE — Patient Instructions (Signed)

## 2022-01-09 NOTE — Progress Notes (Signed)
01/09/2022 10:07 AM   Lucas Leach 1950-04-24 [id number]  Referring provider: Sharilyn Sites, MD [address] [place],  [place] [address]  Followup BPH, ED and elevated PSA   HPI: Lucas Leach is a 71yo here for followup for elevated PSA, BPH and erectile dysfunction. PSA increased to 2.6 from 2.2. IPSS 11 QOL 2 on uroxatral 10mg  BID. He uses vardenafil 20mg  PRN for good results. No other complaints today   PMH: Past Medical History:  Diagnosis Date   GERD (gastroesophageal reflux disease)    Hypothyroidism    Lumbar vertebral fracture ([facility])    L3    Surgical History: Past Surgical History:  Procedure Laterality Date   ADENOIDECTOMY     COLONOSCOPY N/A 09/22/2012   Procedure: COLONOSCOPY;  Surgeon: Jamesetta So, MD;  [facility];  Service: Gastroenterology;  Laterality: N/A;   ESOPHAGOGASTRODUODENOSCOPY (EGD) WITH ESOPHAGEAL DILATION N/A 09/22/2012   Procedure: ESOPHAGOGASTRODUODENOSCOPY (EGD) WITH POSSIBLE ESOPHAGEAL DILATION;  Surgeon: Jamesetta So, MD;  [facility];  Service: Gastroenterology;  Laterality: N/A;   THUMB AMPUTATION Left 2018   TONSILLECTOMY      Home Medications:  Allergies as of 01/09/2022       Reactions   Latex Rash        Medication List        Accurate as of January 09, 2022 10:07 AM. If you have any questions, ask your nurse or doctor.          alfuzosin 10 MG 24 hr tablet Commonly known as: UROXATRAL Take 1 tablet (10 mg total) by mouth in the morning and at bedtime.   aspirin EC 81 MG tablet Take 81 mg by mouth daily.   clotrimazole-betamethasone cream Commonly known as: Lotrisone Apply 1 application topically 2 (two) times daily.   Glucosamine Sulfate 500 MG Tabs Take by mouth.   glucosamine-chondroitin 500-400 MG tablet Take 1 tablet by mouth 3 (three) times daily.   levothyroxine 75 MCG tablet Commonly known as: SYNTHROID Take 75 mcg daily before breakfast by mouth.   multivitamin  with minerals Tabs tablet Take 1 tablet by mouth every other day.   omeprazole 40 MG capsule Commonly known as: PRILOSEC Take by mouth.   omeprazole 40 MG capsule Commonly known as: PriLOSEC Take 1 capsule (40 mg total) by mouth daily.   ondansetron 4 MG tablet Commonly known as: ZOFRAN Take 4-8 mg by mouth every 6 (six) hours as needed.   pantoprazole 20 MG tablet Commonly known as: PROTONIX Take by mouth.   rosuvastatin 10 MG tablet Commonly known as: CRESTOR Take 10 mg by mouth at bedtime.   Spiriva Respimat 2.5 MCG/ACT Aers Generic drug: Tiotropium Bromide Monohydrate SMARTSIG:2 Puff(s) Via Inhaler Daily   sulfamethoxazole-trimethoprim 800-160 MG tablet Commonly known as: BACTRIM DS Take 1 tablet by mouth every 12 (twelve) hours.   vardenafil 20 MG tablet Commonly known as: LEVITRA Take 1 tablet (20 mg total) by mouth daily as needed for erectile dysfunction.        Allergies:  Allergies  Allergen Reactions   Latex Rash    Family History: Family History  Problem Relation Age of Onset   Diabetes Mellitus II Mother    Cerebral aneurysm Father     Social History:  reports that he has quit smoking. His smoking use included cigarettes. He has never used smokeless tobacco. He reports current alcohol use. He reports that he does not use drugs.  ROS: All other review of systems  were reviewed and are negative except what is noted above in HPI  Physical Exam: BP (!) 147/74   Pulse 80   Constitutional:  Alert and oriented, No acute distress. HEENT: [place] AT, moist mucus membranes.  Trachea midline, no masses. Cardiovascular: No clubbing, cyanosis, or edema. Respiratory: Normal respiratory effort, no increased work of breathing. GI: Abdomen is soft, nontender, nondistended, no abdominal masses GU: No CVA tenderness.  Lymph: No cervical or inguinal lymphadenopathy. Skin: No rashes, bruises or suspicious lesions. Neurologic: Grossly intact, no focal deficits,  moving all 4 extremities. Psychiatric: Normal mood and affect.  Laboratory Data: Lab Results  Component Value Date   HGB 14.6 02/25/2008   HCT 43.0 02/25/2008    Lab Results  Component Value Date   CREATININE 0.90 02/27/2021    No results found for: "PSA"  No results found for: "TESTOSTERONE"  No results found for: "HGBA1C"  Urinalysis    Component Value Date/Time   APPEARANCEUR Clear 01/05/2021 1148   GLUCOSEU Negative 01/05/2021 1148   BILIRUBINUR Negative 01/05/2021 1148   PROTEINUR Negative 01/05/2021 1148   NITRITE Negative 01/05/2021 1148   LEUKOCYTESUR Negative 01/05/2021 1148    Lab Results  Component Value Date   LABMICR Comment 01/05/2021   WBCUA 0-5 06/23/2020   LABEPIT 0-10 06/23/2020   BACTERIA None seen 06/23/2020    Pertinent Imaging:  No results found for this or any previous visit.  No results found for this or any previous visit.  No results found for this or any previous visit.  No results found for this or any previous visit.  No results found for this or any previous visit.  No valid procedures specified. No results found for this or any previous visit.  No results found for this or any previous visit.   Assessment & Plan:    1. Elevated PSA Followup 1 year with PSA - Urinalysis, Routine w reflex microscopic  2. Benign localized prostatic hyperplasia with lower urinary tract symptoms (LUTS) Uroxatral 10mg  qhs  3. Nocturia Uroxatral 10mg  qhs  4. Erectile dysfunction due to arterial insufficiency -levitra 20mg  prn   No follow-ups on file.  , MD  [facility] [place]

## 2022-01-10 LAB — URINALYSIS, ROUTINE W REFLEX MICROSCOPIC
Bilirubin, UA: NEGATIVE
Glucose, UA: NEGATIVE
Ketones, UA: NEGATIVE
Leukocytes,UA: NEGATIVE
Nitrite, UA: NEGATIVE
Protein,UA: NEGATIVE
RBC, UA: NEGATIVE
Specific Gravity, UA: 1.025 (ref 1.005–1.030)
Urobilinogen, Ur: 0.2 mg/dL (ref 0.2–1.0)
pH, UA: 5 (ref 5.0–7.5)

## 2022-01-11 NOTE — Telephone Encounter (Signed)
Please see 10/11 telephone encounter

## 2022-01-21 ENCOUNTER — Ambulatory Visit (INDEPENDENT_AMBULATORY_CARE_PROVIDER_SITE_OTHER): Payer: PPO | Admitting: Physician Assistant

## 2022-01-21 ENCOUNTER — Encounter: Payer: Self-pay | Admitting: Physician Assistant

## 2022-01-21 VITALS — BP 136/69 | HR 75 | Wt 205.0 lb

## 2022-01-21 DIAGNOSIS — R300 Dysuria: Secondary | ICD-10-CM | POA: Diagnosis not present

## 2022-01-21 DIAGNOSIS — N489 Disorder of penis, unspecified: Secondary | ICD-10-CM | POA: Diagnosis not present

## 2022-01-21 LAB — MICROSCOPIC EXAMINATION
Bacteria, UA: NONE SEEN
RBC, Urine: NONE SEEN /hpf (ref 0–2)

## 2022-01-21 LAB — URINALYSIS, ROUTINE W REFLEX MICROSCOPIC
Bilirubin, UA: NEGATIVE
Glucose, UA: NEGATIVE
Ketones, UA: NEGATIVE
Nitrite, UA: NEGATIVE
Protein,UA: NEGATIVE
RBC, UA: NEGATIVE
Specific Gravity, UA: 1.02 (ref 1.005–1.030)
Urobilinogen, Ur: 1 mg/dL (ref 0.2–1.0)
pH, UA: 6.5 (ref 5.0–7.5)

## 2022-01-21 MED ORDER — VALACYCLOVIR HCL 1 G PO TABS: 1000.0000 mg | ORAL_TABLET | Freq: Two times a day (BID) | ORAL | 0 refills | Status: AC

## 2022-01-21 NOTE — Patient Instructions (Signed)
Follow up with your primary care provider to discuss testing and further recommendations.

## 2022-01-21 NOTE — Progress Notes (Signed)
Assessment: 1. Dysuria - Urinalysis, Routine w reflex microscopic  2. Penile lesion    Plan: Discussed appearance of lesion and recommended tx for HSV. Valtrex Rx. Pt advised to FU with his PCP for viral testing or further evaluation if the lesion persists or worsens. Advised no contact with his wife until lesions have resolved or he is cleared by his primary care provider.  Meds ordered this encounter  Medications   valACYclovir (VALTREX) 1000 MG tablet    Sig: Take 1 tablet (1,000 mg total) by mouth 2 (two) times daily.    Dispense:  20 tablet    Refill:  0     Chief Complaint: No chief complaint on file.   HPI: Lucas Leach is a 71 y.o. male who presents for evaluation of dysuria x 1 week. Pt has h/o yeast balanitis and has used Monistat without relief.  He describes 1 area on the glans of the penis which is significantly tender especially when he retracts his foreskin to void.  He denies discharge or swelling.  He denies recent injury. No h/o similar type pain. Pt denies h/o known HSV and states monogamous with spouse who has no similar c/o now or in the past.   UA=clear   01/09/22 [name] is a 71yo here for followup for elevated PSA, BPH and erectile dysfunction. PSA increased to 2.6 from 2.2. IPSS 11 QOL 2 on uroxatral 10mg  BID. He uses vardenafil 20mg  PRN for good results. No other complaints today  Portions of the above documentation were copied from a prior visit for review purposes only.  Allergies: Allergies  Allergen Reactions   Latex Rash    PMH: Past Medical History:  Diagnosis Date   GERD (gastroesophageal reflux disease)    Hypothyroidism    Lumbar vertebral fracture ([facility])    L3    PSH: Past Surgical History:  Procedure Laterality Date   ADENOIDECTOMY     COLONOSCOPY N/A 09/22/2012   Procedure: COLONOSCOPY;  Surgeon: Dalia Heading, MD;  [facility];  Service: Gastroenterology;  Laterality: N/A;   ESOPHAGOGASTRODUODENOSCOPY (EGD)  WITH ESOPHAGEAL DILATION N/A 09/22/2012   Procedure: ESOPHAGOGASTRODUODENOSCOPY (EGD) WITH POSSIBLE ESOPHAGEAL DILATION;  Surgeon: Dalia Heading, MD;  [facility];  Service: Gastroenterology;  Laterality: N/A;   THUMB AMPUTATION Left 2018   TONSILLECTOMY      SH: Social History   Tobacco Use   Smoking status: Former    Types: Cigarettes   Smokeless tobacco: Never  Vaping Use   Vaping Use: Never used  Substance Use Topics   Alcohol use: Yes    Comment: 1-2 beers per day   Drug use: No    ROS: All other review of systems were reviewed and are negative except what is noted above in HPI  PE: BP 136/69   Pulse 75   Wt 205 lb (93 kg)   BMI 27.80 kg/m  GENERAL APPEARANCE:  Well appearing, well developed, well nourished, NAD HEENT:  Atraumatic, normocephalic NECK:  Supple. Trachea midline ABDOMEN:  Soft, non-tender, no masses GU: Uncircumcised, foreskin is easily retractable.  A 4 to 5 mm lesion is appreciated on the ventral surface of the glans penis proximal to the urethral meatus.  It is erythematous with well demarcated borders and punctate pustular lesions noted within it.  He has significant tenderness with palpation during exam. Meatus clear without DC. Chaperone present during exam EXTREMITIES:  Moves all extremities well, without clubbing, cyanosis, or edema NEUROLOGIC:  Alert and oriented x 3 MENTAL STATUS:  appropriate BACK:  Non-tender to palpation, No CVAT SKIN:  Warm, dry, and intact   Results: Laboratory Data: Lab Results  Component Value Date   HGB 14.6 02/25/2008   HCT 43.0 02/25/2008    Lab Results  Component Value Date   CREATININE 0.90 02/27/2021    Urinalysis    Component Value Date/Time   APPEARANCEUR Clear 01/09/2022 1027   GLUCOSEU Negative 01/09/2022 1027   BILIRUBINUR Negative 01/09/2022 1027   PROTEINUR Negative 01/09/2022 1027   NITRITE Negative 01/09/2022 1027   LEUKOCYTESUR Negative 01/09/2022 1027    Lab Results   Component Value Date   LABMICR Comment 01/09/2022   WBCUA 0-5 06/23/2020   LABEPIT 0-10 06/23/2020   BACTERIA None seen 06/23/2020    Pertinent Imaging: No results found for this or any previous visit.  No results found for this or any previous visit.  No results found for this or any previous visit.  No results found for this or any previous visit.  No results found for this or any previous visit.  No valid procedures specified. No results found for this or any previous visit.  No results found for this or any previous visit.  No results found for this or any previous visit (from the past 24 hour(s)).

## 2022-02-22 ENCOUNTER — Ambulatory Visit (INDEPENDENT_AMBULATORY_CARE_PROVIDER_SITE_OTHER): Payer: PPO | Admitting: Urology

## 2022-02-22 VITALS — BP 138/74 | HR 85

## 2022-02-22 DIAGNOSIS — N411 Chronic prostatitis: Secondary | ICD-10-CM | POA: Diagnosis not present

## 2022-02-22 DIAGNOSIS — N489 Disorder of penis, unspecified: Secondary | ICD-10-CM | POA: Diagnosis not present

## 2022-02-22 DIAGNOSIS — N481 Balanitis: Secondary | ICD-10-CM | POA: Diagnosis not present

## 2022-02-22 LAB — URINALYSIS, ROUTINE W REFLEX MICROSCOPIC
Bilirubin, UA: NEGATIVE
Glucose, UA: NEGATIVE
Ketones, UA: NEGATIVE
Leukocytes,UA: NEGATIVE
Nitrite, UA: NEGATIVE
Protein,UA: NEGATIVE
RBC, UA: NEGATIVE
Specific Gravity, UA: 1.03 (ref 1.005–1.030)
Urobilinogen, Ur: 0.2 mg/dL (ref 0.2–1.0)
pH, UA: 5 (ref 5.0–7.5)

## 2022-02-22 MED ORDER — FLUCONAZOLE 100 MG PO TABS: 100.0000 mg | ORAL_TABLET | Freq: Every day | ORAL | 0 refills | Status: DC

## 2022-02-22 MED ORDER — CLOTRIMAZOLE-BETAMETHASONE 1-0.05 % EX CREA: 1.0000 | TOPICAL_CREAM | Freq: Two times a day (BID) | CUTANEOUS | 3 refills | Status: DC

## 2022-02-22 NOTE — Patient Instructions (Signed)
Balanitis ? ?Balanitis is swelling and irritation of the head of the penis (glans penis). Balanitis occurs most often among males who have not had their foreskin removed (uncircumcised). In uncircumcised males, the condition may also cause inflammation of the skin around the foreskin. ?Balanitis sometimes causes scarring of the penis or foreskin, which can require surgery. This condition may develop because of an infection or another medical condition. Untreated balanitis can increase the risk of penile cancer. ?What are the causes? ?Common causes of this condition include: ?Irritation and lack of airflow due to fluid (smegma) that can build up on the glans penis. ?Poor personal hygiene, especially in uncircumcised males. Not cleaning the glans penis and foreskin well can result in a buildup of bacteria, viruses, and yeast, which can lead to infection and inflammation. ?Other causes include: ?Chemical irritation from products such as soaps or shower gels, especially those that have fragrance. Chemical irritation can also be caused by condoms, personal lubricants, petroleum jelly, spermicides, fabric softeners, or laundry detergents. ?Skin conditions, such as eczema, dermatitis, and psoriasis. ?Allergies to medicines, such as tetracycline and sulfa drugs. ?What increases the risk? ?The following factors may make you more likely to develop this condition: ?Being an uncircumcised male. ?Having diabetes. ?Having other medical conditions, including liver cirrhosis, congestive heart failure, or kidney disease. ?Having infections, such as candidiasis, HPV (human papillomavirus), herpes simplex, gonorrhea, or syphilis. ?Having a tight foreskin that is difficult to pull back (retract) past the glans penis. ?Being severely obese. ?History of reactive arthritis. ?What are the signs or symptoms? ?Symptoms of this condition include: ?Discharge from under the foreskin, and pain or difficulty retracting the foreskin. ?A bad smell  or itchiness on the penis. ?Tenderness, redness, and swelling of the glans penis. ?A rash or sores on the glans penis or foreskin. ?Inability to get an erection due to pain. ?Trouble urinating. ?Scarring of the penis or foreskin, in some cases. ?How is this diagnosed? ?This condition may be diagnosed based on a physical exam and tests of a swab of discharge to check for bacterial or fungal infection. ?You may also have blood tests to check for: ?Viruses that can cause balanitis. ?A high blood sugar (glucose) level. This could be a sign of diabetes, which can increase the risk of balanitis. ?How is this treated? ?Treatment for this condition depends on the cause. Treatment may include: ?Improving personal hygiene. Your health care provider may recommend sitting in a bath of warm water that is deep enough to cover your hips and buttocks (sitz bath). ?Medicines such as: ?Creams or ointments to reduce swelling (steroids) or to treat an infection. ?Antibiotic medicine. ?Antifungal medicine. ?Having surgery to remove or cut the foreskin (circumcision). This may be done if you have scarring on the foreskin that makes it difficult to retract. ?Controlling other medical problems that may be causing your condition or making it worse. ?Follow these instructions at home: ?Medicines ?Take over-the-counter and prescription medicines only as told by your health care provider. ?If you were prescribed an antibiotic medicine, use it as told by your health care provider. Do not stop using the antibiotic even if you start to feel better. ?General instructions ?Do not have sex until the condition clears up, or until your health care provider approves. ?Keep your penis clean and dry. Take sitz baths as recommended by your health care provider. ?Avoid products that irritate your skin or make symptoms worse, such as soaps and shower gels that have fragrance. ?Keep all follow-up visits. This is   important. ?Contact a health care provider  if: ?Your symptoms get worse or do not improve with home care. ?You develop chills or a fever. ?You have trouble urinating. ?You cannot retract your foreskin. ?Get help right away if: ?You develop severe pain. ?You are unable to urinate. ?Summary ?Balanitis is swelling and irritation of the head of the penis (glans penis). This condition is most common among uncircumcised males. ?Balanitis causes pain, redness, and swelling of the glans penis. ?Good personal hygiene is important. ?Treatment may include improving personal hygiene and applying creams or ointments. ?Contact a health care provider if your symptoms get worse or do not improve with home care. ?This information is not intended to replace advice given to you by your health care provider. Make sure you discuss any questions you have with your health care provider. ?Document Revised: 08/23/2020 Document Reviewed: 08/23/2020 ?Elsevier Patient Education ? 2023 Elsevier Inc. ? ?

## 2022-03-05 ENCOUNTER — Other Ambulatory Visit: Payer: Self-pay

## 2022-03-05 ENCOUNTER — Encounter: Payer: Self-pay | Admitting: Urology

## 2022-03-05 ENCOUNTER — Telehealth: Payer: Self-pay

## 2022-03-05 DIAGNOSIS — N401 Enlarged prostate with lower urinary tract symptoms: Secondary | ICD-10-CM

## 2022-03-05 MED ORDER — ALFUZOSIN HCL ER 10 MG PO TB24: 10.0000 mg | ORAL_TABLET | Freq: Two times a day (BID) | ORAL | 3 refills | Status: DC

## 2022-03-05 NOTE — Progress Notes (Signed)
Pt requested 90 day supply of alfuzosin be sent to pharmacy.  Rx updated and sent.

## 2022-03-05 NOTE — Telephone Encounter (Signed)
Patient requesting a 90 day supply of alfuzosin (UROXATRAL) 10 MG 24 hr tablet. It will cost less per insurance cost.  Pharmacy:   [facility] - [place] - [address] Phone: [phone]  Fax: [phone]     Thanks, Rosey Bath

## 2022-03-05 NOTE — Telephone Encounter (Signed)
Rx updated and sent to pharmacy 

## 2022-03-05 NOTE — Progress Notes (Signed)
02/22/2022 8:00 AM   [name] Mar 05, 1951 [id number]  Referring provider: Assunta Found, MD [address] [place],  [place] [address]  Penile lesion   HPI: [name] is a 71yo here for evaluation of a penile lesion. Starting 7-10 days ago he noted irritation of his glans and penile skin. He has itching. No history of DMII. No worsening LUTS. IPSS 12 QOl 2 on uroxatral 10mg  BID. He uses vardenafil PRN for his erectile dysfunction which works well.   PMH: Past Medical History:  Diagnosis Date   GERD (gastroesophageal reflux disease)    Hypothyroidism    Lumbar vertebral fracture (HCC)    L3    Surgical History: Past Surgical History:  Procedure Laterality Date   ADENOIDECTOMY     COLONOSCOPY N/A 09/22/2012   Procedure: COLONOSCOPY;  Surgeon: 11/23/2012, MD;  [facility];  Service: Gastroenterology;  Laterality: N/A;   ESOPHAGOGASTRODUODENOSCOPY (EGD) WITH ESOPHAGEAL DILATION N/A 09/22/2012   Procedure: ESOPHAGOGASTRODUODENOSCOPY (EGD) WITH POSSIBLE ESOPHAGEAL DILATION;  Surgeon: 11/23/2012, MD;  [facility];  Service: Gastroenterology;  Laterality: N/A;   THUMB AMPUTATION Left 2018   TONSILLECTOMY      Home Medications:  Allergies as of 02/22/2022       Reactions   Latex Rash        Medication List        Accurate as of February 22, 2022 11:59 PM. If you have any questions, ask your nurse or doctor.          alfuzosin 10 MG 24 hr tablet Commonly known as: UROXATRAL Take 1 tablet (10 mg total) by mouth in the morning and at bedtime.   aspirin EC 81 MG tablet Take 81 mg by mouth daily.   clotrimazole-betamethasone cream Commonly known as: Lotrisone Apply 1 Application topically 2 (two) times daily.   fluconazole 100 MG tablet Commonly known as: DIFLUCAN Take 1 tablet (100 mg total) by mouth daily. X 7 days   Glucosamine Sulfate 500 MG Tabs Take by mouth.   glucosamine-chondroitin 500-400 MG tablet Take 1 tablet  by mouth 3 (three) times daily.   levothyroxine 100 MCG tablet Commonly known as: SYNTHROID Take 100 mcg by mouth daily before breakfast.   multivitamin with minerals Tabs tablet Take 1 tablet by mouth every other day.   omeprazole 40 MG capsule Commonly known as: PRILOSEC Take by mouth.   ondansetron 4 MG tablet Commonly known as: ZOFRAN Take 4-8 mg by mouth every 6 (six) hours as needed.   pantoprazole 20 MG tablet Commonly known as: PROTONIX Take by mouth.   rosuvastatin 10 MG tablet Commonly known as: CRESTOR Take 10 mg by mouth at bedtime.   Spiriva Respimat 2.5 MCG/ACT Aers Generic drug: Tiotropium Bromide Monohydrate SMARTSIG:2 Puff(s) Via Inhaler Daily   sulfamethoxazole-trimethoprim 800-160 MG tablet Commonly known as: BACTRIM DS Take 1 tablet by mouth every 12 (twelve) hours.   valACYclovir 1000 MG tablet Commonly known as: Valtrex Take 1 tablet (1,000 mg total) by mouth 2 (two) times daily.   vardenafil 20 MG tablet Commonly known as: LEVITRA Take 1 tablet (20 mg total) by mouth daily as needed for erectile dysfunction.        Allergies:  Allergies  Allergen Reactions   Latex Rash    Family History: Family History  Problem Relation Age of Onset   Diabetes Mellitus II Mother    Cerebral aneurysm Father     Social History:  reports that he  has quit smoking. His smoking use included cigarettes. He has never used smokeless tobacco. He reports current alcohol use. He reports that he does not use drugs.  ROS: All other review of systems were reviewed and are negative except what is noted above in HPI  Physical Exam: BP 138/74   Pulse 85   Constitutional:  Alert and oriented, No acute distress. HEENT: [place] AT, moist mucus membranes.  Trachea midline, no masses. Cardiovascular: No clubbing, cyanosis, or edema. Respiratory: Normal respiratory effort, no increased work of breathing. GI: Abdomen is soft, nontender, nondistended, no abdominal  masses GU: No CVA tenderness.  Lymph: No cervical or inguinal lymphadenopathy. Skin: No rashes, bruises or suspicious lesions. Neurologic: Grossly intact, no focal deficits, moving all 4 extremities. Psychiatric: Normal mood and affect.  Laboratory Data: Lab Results  Component Value Date   HGB 14.6 02/25/2008   HCT 43.0 02/25/2008    Lab Results  Component Value Date   CREATININE 0.90 02/27/2021    No results found for: "PSA"  No results found for: "TESTOSTERONE"  No results found for: "HGBA1C"  Urinalysis    Component Value Date/Time   APPEARANCEUR Clear 02/22/2022 1031   GLUCOSEU Negative 02/22/2022 1031   BILIRUBINUR Negative 02/22/2022 1031   PROTEINUR Negative 02/22/2022 1031   NITRITE Negative 02/22/2022 1031   LEUKOCYTESUR Negative 02/22/2022 1031    Lab Results  Component Value Date   LABMICR Comment 02/22/2022   WBCUA 0-5 01/21/2022   LABEPIT 0-10 01/21/2022   BACTERIA None seen 01/21/2022    Pertinent Imaging:  No results found for this or any previous visit.  No results found for this or any previous visit.  No results found for this or any previous visit.  No results found for this or any previous visit.  No results found for this or any previous visit.  No valid procedures specified. No results found for this or any previous visit.  No results found for this or any previous visit.   Assessment & Plan:    1. Chronic prostatitis without hematuria -Continue uroxatral 10mg  QHS - Urinalysis, Routine w reflex microscopic  2. Penile lesion -clotrimazole BID for 21 days  3. Balanitis -diflucan 100mg  daily for 7 days   Return in about 3 weeks (around 03/15/2022).  , MD  [facility] [place]

## 2022-03-27 ENCOUNTER — Ambulatory Visit (INDEPENDENT_AMBULATORY_CARE_PROVIDER_SITE_OTHER): Payer: PPO | Admitting: Urology

## 2022-03-27 VITALS — BP 130/79 | HR 75

## 2022-03-27 DIAGNOSIS — N411 Chronic prostatitis: Secondary | ICD-10-CM

## 2022-03-27 DIAGNOSIS — N5082 Scrotal pain: Secondary | ICD-10-CM | POA: Diagnosis not present

## 2022-03-27 DIAGNOSIS — N481 Balanitis: Secondary | ICD-10-CM | POA: Diagnosis not present

## 2022-03-27 MED ORDER — CLOTRIMAZOLE-BETAMETHASONE 1-0.05 % EX CREA: 1.0000 | TOPICAL_CREAM | Freq: Two times a day (BID) | CUTANEOUS | 3 refills | Status: AC

## 2022-03-27 MED ORDER — FLUCONAZOLE 100 MG PO TABS: 100.0000 mg | ORAL_TABLET | Freq: Every day | ORAL | 0 refills | Status: DC

## 2022-03-27 NOTE — Progress Notes (Signed)
03/27/2022 1:39 PM   Lucas Leach January 17, 1951 [id number]  Referring provider: Sharilyn Sites, MD [address] [place],  [place] [address]  Followup penile lesion   HPI: [name] is a 72yo here for followup for balanitis and chronic prostatitis. IPSS 13 QOL 2 on uroxatral 10mg . Urine stream strong. No dysuria. Nocturia 1-2x. He noted improvement in his balanitis with clotrimazole and difulcan but 1 week ago he noted return of the irration and erythema of his glans and scrotum. He is out of the cream. No other complaints today    PMH: Past Medical History:  Diagnosis Date   GERD (gastroesophageal reflux disease)    Hypothyroidism    Lumbar vertebral fracture ([place])    L3    Surgical History: Past Surgical History:  Procedure Laterality Date   ADENOIDECTOMY     COLONOSCOPY N/A 09/22/2012   Procedure: COLONOSCOPY;  Surgeon: Jamesetta So, MD;  [facility];  Service: Gastroenterology;  Laterality: N/A;   ESOPHAGOGASTRODUODENOSCOPY (EGD) WITH ESOPHAGEAL DILATION N/A 09/22/2012   Procedure: ESOPHAGOGASTRODUODENOSCOPY (EGD) WITH POSSIBLE ESOPHAGEAL DILATION;  Surgeon: Jamesetta So, MD;  [facility];  Service: Gastroenterology;  Laterality: N/A;   THUMB AMPUTATION Left 2018   TONSILLECTOMY      Home Medications:  Allergies as of 03/27/2022       Reactions   Latex Rash        Medication List        Accurate as of March 27, 2022  1:39 PM. If you have any questions, ask your nurse or doctor.          alfuzosin 10 MG 24 hr tablet Commonly known as: UROXATRAL Take 1 tablet (10 mg total) by mouth in the morning and at bedtime.   aspirin EC 81 MG tablet Take 81 mg by mouth daily.   clotrimazole-betamethasone cream Commonly known as: Lotrisone Apply 1 Application topically 2 (two) times daily.   fluconazole 100 MG tablet Commonly known as: DIFLUCAN Take 1 tablet (100 mg total) by mouth daily. X 7 days   Glucosamine Sulfate 500 MG  Tabs Take by mouth.   glucosamine-chondroitin 500-400 MG tablet Take 1 tablet by mouth 3 (three) times daily.   levothyroxine 100 MCG tablet Commonly known as: SYNTHROID Take 100 mcg by mouth daily before breakfast.   multivitamin with minerals Tabs tablet Take 1 tablet by mouth every other day.   omeprazole 40 MG capsule Commonly known as: PRILOSEC Take by mouth.   ondansetron 4 MG tablet Commonly known as: ZOFRAN Take 4-8 mg by mouth every 6 (six) hours as needed.   pantoprazole 20 MG tablet Commonly known as: PROTONIX Take by mouth.   rosuvastatin 10 MG tablet Commonly known as: CRESTOR Take 10 mg by mouth at bedtime.   Spiriva Respimat 2.5 MCG/ACT Aers Generic drug: Tiotropium Bromide Monohydrate SMARTSIG:2 Puff(s) Via Inhaler Daily   sulfamethoxazole-trimethoprim 800-160 MG tablet Commonly known as: BACTRIM DS Take 1 tablet by mouth every 12 (twelve) hours.   valACYclovir 1000 MG tablet Commonly known as: Valtrex Take 1 tablet (1,000 mg total) by mouth 2 (two) times daily.   vardenafil 20 MG tablet Commonly known as: LEVITRA Take 1 tablet (20 mg total) by mouth daily as needed for erectile dysfunction.        Allergies:  Allergies  Allergen Reactions   Latex Rash    Family History: Family History  Problem Relation Age of Onset   Diabetes Mellitus II Mother  Cerebral aneurysm Father     Social History:  reports that he has quit smoking. His smoking use included cigarettes. He has never used smokeless tobacco. He reports current alcohol use. He reports that he does not use drugs.  ROS: All other review of systems were reviewed and are negative except what is noted above in HPI  Physical Exam: BP 130/79   Pulse 75   Constitutional:  Alert and oriented, No acute distress. HEENT: [place] AT, moist mucus membranes.  Trachea midline, no masses. Cardiovascular: No clubbing, cyanosis, or edema. Respiratory: Normal respiratory effort, no increased work  of breathing. GI: Abdomen is soft, nontender, nondistended, no abdominal masses GU: No CVA tenderness. Circumcised phallus. No masses/lesions on penis, testis, scrotum. Balanitis present. Lymph: No cervical or inguinal lymphadenopathy. Skin: No rashes, bruises or suspicious lesions. Neurologic: Grossly intact, no focal deficits, moving all 4 extremities. Psychiatric: Normal mood and affect.  Laboratory Data: Lab Results  Component Value Date   HGB 14.6 02/25/2008   HCT 43.0 02/25/2008    Lab Results  Component Value Date   CREATININE 0.90 02/27/2021    No results found for: "PSA"  No results found for: "TESTOSTERONE"  No results found for: "HGBA1C"  Urinalysis    Component Value Date/Time   APPEARANCEUR Clear 02/22/2022 1031   GLUCOSEU Negative 02/22/2022 1031   BILIRUBINUR Negative 02/22/2022 1031   PROTEINUR Negative 02/22/2022 1031   NITRITE Negative 02/22/2022 1031   LEUKOCYTESUR Negative 02/22/2022 1031    Lab Results  Component Value Date   LABMICR Comment 02/22/2022   WBCUA 0-5 01/21/2022   LABEPIT 0-10 01/21/2022   BACTERIA None seen 01/21/2022    Pertinent Imaging:  No results found for this or any previous visit.  No results found for this or any previous visit.  No results found for this or any previous visit.  No results found for this or any previous visit.  No results found for this or any previous visit.  No valid procedures specified. No results found for this or any previous visit.  No results found for this or any previous visit.   Assessment & Plan:    1. Balanitis -Clotrimazole BID and diflucan 100mg  daily for 7 days  2. Chronic prostatitis without hematuria -continue uroxatral 10mg   3. Scrotal pain -clotrimazole BID   No follow-ups on file.  Nicolette Bang, MD  [facility] [place]

## 2022-04-02 ENCOUNTER — Encounter: Payer: Self-pay | Admitting: Urology

## 2022-04-02 NOTE — Patient Instructions (Signed)
Balanitis ? ?Balanitis is swelling and irritation of the head of the penis (glans penis). Balanitis occurs most often among males who have not had their foreskin removed (uncircumcised). In uncircumcised males, the condition may also cause inflammation of the skin around the foreskin. ?Balanitis sometimes causes scarring of the penis or foreskin, which can require surgery. This condition may develop because of an infection or another medical condition. Untreated balanitis can increase the risk of penile cancer. ?What are the causes? ?Common causes of this condition include: ?Irritation and lack of airflow due to fluid (smegma) that can build up on the glans penis. ?Poor personal hygiene, especially in uncircumcised males. Not cleaning the glans penis and foreskin well can result in a buildup of bacteria, viruses, and yeast, which can lead to infection and inflammation. ?Other causes include: ?Chemical irritation from products such as soaps or shower gels, especially those that have fragrance. Chemical irritation can also be caused by condoms, personal lubricants, petroleum jelly, spermicides, fabric softeners, or laundry detergents. ?Skin conditions, such as eczema, dermatitis, and psoriasis. ?Allergies to medicines, such as tetracycline and sulfa drugs. ?What increases the risk? ?The following factors may make you more likely to develop this condition: ?Being an uncircumcised male. ?Having diabetes. ?Having other medical conditions, including liver cirrhosis, congestive heart failure, or kidney disease. ?Having infections, such as candidiasis, HPV (human papillomavirus), herpes simplex, gonorrhea, or syphilis. ?Having a tight foreskin that is difficult to pull back (retract) past the glans penis. ?Being severely obese. ?History of reactive arthritis. ?What are the signs or symptoms? ?Symptoms of this condition include: ?Discharge from under the foreskin, and pain or difficulty retracting the foreskin. ?A bad smell  or itchiness on the penis. ?Tenderness, redness, and swelling of the glans penis. ?A rash or sores on the glans penis or foreskin. ?Inability to get an erection due to pain. ?Trouble urinating. ?Scarring of the penis or foreskin, in some cases. ?How is this diagnosed? ?This condition may be diagnosed based on a physical exam and tests of a swab of discharge to check for bacterial or fungal infection. ?You may also have blood tests to check for: ?Viruses that can cause balanitis. ?A high blood sugar (glucose) level. This could be a sign of diabetes, which can increase the risk of balanitis. ?How is this treated? ?Treatment for this condition depends on the cause. Treatment may include: ?Improving personal hygiene. Your health care provider may recommend sitting in a bath of warm water that is deep enough to cover your hips and buttocks (sitz bath). ?Medicines such as: ?Creams or ointments to reduce swelling (steroids) or to treat an infection. ?Antibiotic medicine. ?Antifungal medicine. ?Having surgery to remove or cut the foreskin (circumcision). This may be done if you have scarring on the foreskin that makes it difficult to retract. ?Controlling other medical problems that may be causing your condition or making it worse. ?Follow these instructions at home: ?Medicines ?Take over-the-counter and prescription medicines only as told by your health care provider. ?If you were prescribed an antibiotic medicine, use it as told by your health care provider. Do not stop using the antibiotic even if you start to feel better. ?General instructions ?Do not have sex until the condition clears up, or until your health care provider approves. ?Keep your penis clean and dry. Take sitz baths as recommended by your health care provider. ?Avoid products that irritate your skin or make symptoms worse, such as soaps and shower gels that have fragrance. ?Keep all follow-up visits. This is   important. ?Contact a health care provider  if: ?Your symptoms get worse or do not improve with home care. ?You develop chills or a fever. ?You have trouble urinating. ?You cannot retract your foreskin. ?Get help right away if: ?You develop severe pain. ?You are unable to urinate. ?Summary ?Balanitis is swelling and irritation of the head of the penis (glans penis). This condition is most common among uncircumcised males. ?Balanitis causes pain, redness, and swelling of the glans penis. ?Good personal hygiene is important. ?Treatment may include improving personal hygiene and applying creams or ointments. ?Contact a health care provider if your symptoms get worse or do not improve with home care. ?This information is not intended to replace advice given to you by your health care provider. Make sure you discuss any questions you have with your health care provider. ?Document Revised: 08/23/2020 Document Reviewed: 08/23/2020 ?Elsevier Patient Education ? 2023 Elsevier Inc. ? ?

## 2022-04-10 ENCOUNTER — Ambulatory Visit (INDEPENDENT_AMBULATORY_CARE_PROVIDER_SITE_OTHER): Payer: PPO | Admitting: Urology

## 2022-04-10 ENCOUNTER — Encounter: Payer: Self-pay | Admitting: Urology

## 2022-04-10 VITALS — BP 137/84 | HR 75

## 2022-04-10 DIAGNOSIS — N411 Chronic prostatitis: Secondary | ICD-10-CM | POA: Diagnosis not present

## 2022-04-10 DIAGNOSIS — N481 Balanitis: Secondary | ICD-10-CM | POA: Diagnosis not present

## 2022-04-10 DIAGNOSIS — N5082 Scrotal pain: Secondary | ICD-10-CM

## 2022-04-10 MED ORDER — DOXYCYCLINE HYCLATE 100 MG PO CAPS: 100.0000 mg | ORAL_CAPSULE | Freq: Two times a day (BID) | ORAL | 0 refills | Status: DC

## 2022-04-10 MED ORDER — NYSTATIN-TRIAMCINOLONE 100000-0.1 UNIT/GM-% EX OINT: 1.0000 | TOPICAL_OINTMENT | Freq: Two times a day (BID) | CUTANEOUS | 3 refills | Status: DC

## 2022-04-10 NOTE — Patient Instructions (Signed)
Balanitis ? ?Balanitis is swelling and irritation of the head of the penis (glans penis). Balanitis occurs most often among males who have not had their foreskin removed (uncircumcised). In uncircumcised males, the condition may also cause inflammation of the skin around the foreskin. ?Balanitis sometimes causes scarring of the penis or foreskin, which can require surgery. This condition may develop because of an infection or another medical condition. Untreated balanitis can increase the risk of penile cancer. ?What are the causes? ?Common causes of this condition include: ?Irritation and lack of airflow due to fluid (smegma) that can build up on the glans penis. ?Poor personal hygiene, especially in uncircumcised males. Not cleaning the glans penis and foreskin well can result in a buildup of bacteria, viruses, and yeast, which can lead to infection and inflammation. ?Other causes include: ?Chemical irritation from products such as soaps or shower gels, especially those that have fragrance. Chemical irritation can also be caused by condoms, personal lubricants, petroleum jelly, spermicides, fabric softeners, or laundry detergents. ?Skin conditions, such as eczema, dermatitis, and psoriasis. ?Allergies to medicines, such as tetracycline and sulfa drugs. ?What increases the risk? ?The following factors may make you more likely to develop this condition: ?Being an uncircumcised male. ?Having diabetes. ?Having other medical conditions, including liver cirrhosis, congestive heart failure, or kidney disease. ?Having infections, such as candidiasis, HPV (human papillomavirus), herpes simplex, gonorrhea, or syphilis. ?Having a tight foreskin that is difficult to pull back (retract) past the glans penis. ?Being severely obese. ?History of reactive arthritis. ?What are the signs or symptoms? ?Symptoms of this condition include: ?Discharge from under the foreskin, and pain or difficulty retracting the foreskin. ?A bad smell  or itchiness on the penis. ?Tenderness, redness, and swelling of the glans penis. ?A rash or sores on the glans penis or foreskin. ?Inability to get an erection due to pain. ?Trouble urinating. ?Scarring of the penis or foreskin, in some cases. ?How is this diagnosed? ?This condition may be diagnosed based on a physical exam and tests of a swab of discharge to check for bacterial or fungal infection. ?You may also have blood tests to check for: ?Viruses that can cause balanitis. ?A high blood sugar (glucose) level. This could be a sign of diabetes, which can increase the risk of balanitis. ?How is this treated? ?Treatment for this condition depends on the cause. Treatment may include: ?Improving personal hygiene. Your health care provider may recommend sitting in a bath of warm water that is deep enough to cover your hips and buttocks (sitz bath). ?Medicines such as: ?Creams or ointments to reduce swelling (steroids) or to treat an infection. ?Antibiotic medicine. ?Antifungal medicine. ?Having surgery to remove or cut the foreskin (circumcision). This may be done if you have scarring on the foreskin that makes it difficult to retract. ?Controlling other medical problems that may be causing your condition or making it worse. ?Follow these instructions at home: ?Medicines ?Take over-the-counter and prescription medicines only as told by your health care provider. ?If you were prescribed an antibiotic medicine, use it as told by your health care provider. Do not stop using the antibiotic even if you start to feel better. ?General instructions ?Do not have sex until the condition clears up, or until your health care provider approves. ?Keep your penis clean and dry. Take sitz baths as recommended by your health care provider. ?Avoid products that irritate your skin or make symptoms worse, such as soaps and shower gels that have fragrance. ?Keep all follow-up visits. This is   important. ?Contact a health care provider  if: ?Your symptoms get worse or do not improve with home care. ?You develop chills or a fever. ?You have trouble urinating. ?You cannot retract your foreskin. ?Get help right away if: ?You develop severe pain. ?You are unable to urinate. ?Summary ?Balanitis is swelling and irritation of the head of the penis (glans penis). This condition is most common among uncircumcised males. ?Balanitis causes pain, redness, and swelling of the glans penis. ?Good personal hygiene is important. ?Treatment may include improving personal hygiene and applying creams or ointments. ?Contact a health care provider if your symptoms get worse or do not improve with home care. ?This information is not intended to replace advice given to you by your health care provider. Make sure you discuss any questions you have with your health care provider. ?Document Revised: 08/23/2020 Document Reviewed: 08/23/2020 ?Elsevier Patient Education ? 2023 Elsevier Inc. ? ?

## 2022-04-10 NOTE — Progress Notes (Signed)
04/10/2022 2:12 PM   Lucas Leach 04-12-1950 [id number]  Referring provider: Sharilyn Sites, MD [address] [place],  [place] [address]  Followup penile lesions   HPI: [name] is a 72yo here for followuop for a penile and scrotal lesion. He had mild improvement in his penile irritation and scrotal irritation with clotrimazole cream and diflucan 100mg  daily for 7 days. He denies nay worsening LUTS. NO other associated symptoms   PMH: Past Medical History:  Diagnosis Date   GERD (gastroesophageal reflux disease)    Hypothyroidism    Lumbar vertebral fracture ([place])    L3    Surgical History: Past Surgical History:  Procedure Laterality Date   ADENOIDECTOMY     COLONOSCOPY N/A 09/22/2012   Procedure: COLONOSCOPY;  Surgeon: Jamesetta So, MD;  [facility];  Service: Gastroenterology;  Laterality: N/A;   ESOPHAGOGASTRODUODENOSCOPY (EGD) WITH ESOPHAGEAL DILATION N/A 09/22/2012   Procedure: ESOPHAGOGASTRODUODENOSCOPY (EGD) WITH POSSIBLE ESOPHAGEAL DILATION;  Surgeon: Jamesetta So, MD;  [facility];  Service: Gastroenterology;  Laterality: N/A;   THUMB AMPUTATION Left 2018   TONSILLECTOMY      Home Medications:  Allergies as of 04/10/2022       Reactions   Latex Rash        Medication List        Accurate as of April 10, 2022  2:12 PM. If you have any questions, ask your nurse or doctor.          alfuzosin 10 MG 24 hr tablet Commonly known as: UROXATRAL Take 1 tablet (10 mg total) by mouth in the morning and at bedtime.   aspirin EC 81 MG tablet Take 81 mg by mouth daily.   clotrimazole-betamethasone cream Commonly known as: Lotrisone Apply 1 Application topically 2 (two) times daily.   fluconazole 100 MG tablet Commonly known as: DIFLUCAN Take 1 tablet (100 mg total) by mouth daily. X 7 days   fluconazole 100 MG tablet Commonly known as: DIFLUCAN Take 1 tablet (100 mg total) by mouth daily. X 7 days   Glucosamine  Sulfate 500 MG Tabs Take by mouth.   glucosamine-chondroitin 500-400 MG tablet Take 1 tablet by mouth 3 (three) times daily.   levothyroxine 100 MCG tablet Commonly known as: SYNTHROID Take 100 mcg by mouth daily before breakfast.   multivitamin with minerals Tabs tablet Take 1 tablet by mouth every other day.   omeprazole 40 MG capsule Commonly known as: PRILOSEC Take by mouth.   ondansetron 4 MG tablet Commonly known as: ZOFRAN Take 4-8 mg by mouth every 6 (six) hours as needed.   pantoprazole 20 MG tablet Commonly known as: PROTONIX Take by mouth.   rosuvastatin 10 MG tablet Commonly known as: CRESTOR Take 10 mg by mouth at bedtime.   Spiriva Respimat 2.5 MCG/ACT Aers Generic drug: Tiotropium Bromide Monohydrate SMARTSIG:2 Puff(s) Via Inhaler Daily   sulfamethoxazole-trimethoprim 800-160 MG tablet Commonly known as: BACTRIM DS Take 1 tablet by mouth every 12 (twelve) hours.   valACYclovir 1000 MG tablet Commonly known as: Valtrex Take 1 tablet (1,000 mg total) by mouth 2 (two) times daily.   vardenafil 20 MG tablet Commonly known as: LEVITRA Take 1 tablet (20 mg total) by mouth daily as needed for erectile dysfunction.        Allergies:  Allergies  Allergen Reactions   Latex Rash    Family History: Family History  Problem Relation Age of Onset   Diabetes Mellitus II Mother  Cerebral aneurysm Father     Social History:  reports that he has quit smoking. His smoking use included cigarettes. He has never used smokeless tobacco. He reports current alcohol use. He reports that he does not use drugs.  ROS: All other review of systems were reviewed and are negative except what is noted above in HPI  Physical Exam: BP 137/84   Pulse 75   Constitutional:  Alert and oriented, No acute distress. HEENT: [place] AT, moist mucus membranes.  Trachea midline, no masses. Cardiovascular: No clubbing, cyanosis, or edema. Respiratory: Normal respiratory effort, no  increased work of breathing. GI: Abdomen is soft, nontender, nondistended, no abdominal masses GU: No CVA tenderness. Circumcised phallus. No masses/lesions on penis, testis, scrotum. Mild improvement in erythema of the scrotum and glans Lymph: No cervical or inguinal lymphadenopathy. Skin: No rashes, bruises or suspicious lesions. Neurologic: Grossly intact, no focal deficits, moving all 4 extremities. Psychiatric: Normal mood and affect.  Laboratory Data: Lab Results  Component Value Date   HGB 14.6 02/25/2008   HCT 43.0 02/25/2008    Lab Results  Component Value Date   CREATININE 0.90 02/27/2021    No results found for: "PSA"  No results found for: "TESTOSTERONE"  No results found for: "HGBA1C"  Urinalysis    Component Value Date/Time   APPEARANCEUR Clear 02/22/2022 1031   GLUCOSEU Negative 02/22/2022 1031   BILIRUBINUR Negative 02/22/2022 1031   PROTEINUR Negative 02/22/2022 1031   NITRITE Negative 02/22/2022 1031   LEUKOCYTESUR Negative 02/22/2022 1031    Lab Results  Component Value Date   LABMICR Comment 02/22/2022   WBCUA 0-5 01/21/2022   LABEPIT 0-10 01/21/2022   BACTERIA None seen 01/21/2022    Pertinent Imaging:  No results found for this or any previous visit.  No results found for this or any previous visit.  No results found for this or any previous visit.  No results found for this or any previous visit.  No results found for this or any previous visit.  No valid procedures specified. No results found for this or any previous visit.  No results found for this or any previous visit.   Assessment & Plan:    1. Balanitis -nystatin cream BID for 21 days  2. Chronic prostatitis without hematuria -doxycycline 100mg  BID for 14 days  3. Scrotal pain -nystatin cream BID   No follow-ups on file.  Nicolette Bang, MD  [facility] [place]

## 2022-05-17 ENCOUNTER — Encounter: Payer: Self-pay | Admitting: Urology

## 2022-05-17 ENCOUNTER — Ambulatory Visit (INDEPENDENT_AMBULATORY_CARE_PROVIDER_SITE_OTHER): Payer: PPO | Admitting: Urology

## 2022-05-17 VITALS — BP 146/72 | HR 69

## 2022-05-17 DIAGNOSIS — N481 Balanitis: Secondary | ICD-10-CM | POA: Diagnosis not present

## 2022-05-17 DIAGNOSIS — N5082 Scrotal pain: Secondary | ICD-10-CM

## 2022-05-17 LAB — URINALYSIS, ROUTINE W REFLEX MICROSCOPIC
Bilirubin, UA: NEGATIVE
Glucose, UA: NEGATIVE
Ketones, UA: NEGATIVE
Leukocytes,UA: NEGATIVE
Nitrite, UA: NEGATIVE
Protein,UA: NEGATIVE
RBC, UA: NEGATIVE
Specific Gravity, UA: 1.03 (ref 1.005–1.030)
Urobilinogen, Ur: 0.2 mg/dL (ref 0.2–1.0)
pH, UA: 5.5 (ref 5.0–7.5)

## 2022-05-17 MED ORDER — HYDROXYZINE HCL 10 MG PO TABS: 10.0000 mg | ORAL_TABLET | Freq: Two times a day (BID) | ORAL | 0 refills | Status: DC

## 2022-05-17 NOTE — Progress Notes (Signed)
05/17/2022 1:25 PM   Lucas Leach Jul 10, 1950 [id number]  Referring provider: Sharilyn Sites, MD [address] [place],  [place] [address]  Followup penile irritation   HPI: [name] is a 72yo here for followup for balanitis and cellulitis. He notes no improvement with antibiotics and antifungals failed to improve the scrotal irritation. He has itching and burning of his scrotal skin.    PMH: Past Medical History:  Diagnosis Date   GERD (gastroesophageal reflux disease)    Hypothyroidism    Lumbar vertebral fracture ([place])    L3    Surgical History: Past Surgical History:  Procedure Laterality Date   ADENOIDECTOMY     COLONOSCOPY N/A 09/22/2012   Procedure: COLONOSCOPY;  Surgeon: Jamesetta So, MD;  [facility];  Service: Gastroenterology;  Laterality: N/A;   ESOPHAGOGASTRODUODENOSCOPY (EGD) WITH ESOPHAGEAL DILATION N/A 09/22/2012   Procedure: ESOPHAGOGASTRODUODENOSCOPY (EGD) WITH POSSIBLE ESOPHAGEAL DILATION;  Surgeon: Jamesetta So, MD;  [facility];  Service: Gastroenterology;  Laterality: N/A;   THUMB AMPUTATION Left 2018   TONSILLECTOMY      Home Medications:  Allergies as of 05/17/2022       Reactions   Latex Rash        Medication List        Accurate as of May 17, 2022  1:25 PM. If you have any questions, ask your nurse or doctor.          alfuzosin 10 MG 24 hr tablet Commonly known as: UROXATRAL Take 1 tablet (10 mg total) by mouth in the morning and at bedtime.   aspirin EC 81 MG tablet Take 81 mg by mouth daily.   clotrimazole-betamethasone cream Commonly known as: Lotrisone Apply 1 Application topically 2 (two) times daily.   doxycycline 100 MG capsule Commonly known as: VIBRAMYCIN Take 1 capsule (100 mg total) by mouth every 12 (twelve) hours.   fluconazole 100 MG tablet Commonly known as: DIFLUCAN Take 1 tablet (100 mg total) by mouth daily. X 7 days   fluconazole 100 MG tablet Commonly known as:  DIFLUCAN Take 1 tablet (100 mg total) by mouth daily. X 7 days   Glucosamine Sulfate 500 MG Tabs Take by mouth.   glucosamine-chondroitin 500-400 MG tablet Take 1 tablet by mouth 3 (three) times daily.   levothyroxine 100 MCG tablet Commonly known as: SYNTHROID Take 100 mcg by mouth daily before breakfast.   multivitamin with minerals Tabs tablet Take 1 tablet by mouth every other day.   nystatin-triamcinolone ointment Commonly known as: MYCOLOG Apply 1 Application topically 2 (two) times daily.   omeprazole 40 MG capsule Commonly known as: PRILOSEC Take by mouth.   ondansetron 4 MG tablet Commonly known as: ZOFRAN Take 4-8 mg by mouth every 6 (six) hours as needed.   pantoprazole 20 MG tablet Commonly known as: PROTONIX Take by mouth.   rosuvastatin 10 MG tablet Commonly known as: CRESTOR Take 10 mg by mouth at bedtime.   Spiriva Respimat 2.5 MCG/ACT Aers Generic drug: Tiotropium Bromide Monohydrate SMARTSIG:2 Puff(s) Via Inhaler Daily   sulfamethoxazole-trimethoprim 800-160 MG tablet Commonly known as: BACTRIM DS Take 1 tablet by mouth every 12 (twelve) hours.   valACYclovir 1000 MG tablet Commonly known as: Valtrex Take 1 tablet (1,000 mg total) by mouth 2 (two) times daily.   vardenafil 20 MG tablet Commonly known as: LEVITRA Take 1 tablet (20 mg total) by mouth daily as needed for erectile dysfunction.        Allergies:  Allergies  Allergen Reactions   Latex Rash    Family History: Family History  Problem Relation Age of Onset   Diabetes Mellitus II Mother    Cerebral aneurysm Father     Social History:  reports that he has quit smoking. His smoking use included cigarettes. He has never used smokeless tobacco. He reports current alcohol use. He reports that he does not use drugs.  ROS: All other review of systems were reviewed and are negative except what is noted above in HPI  Physical Exam: BP (!) 146/72   Pulse 69   Constitutional:   Alert and oriented, No acute distress. HEENT:  AT, moist mucus membranes.  Trachea midline, no masses. Cardiovascular: No clubbing, cyanosis, or edema. Respiratory: Normal respiratory effort, no increased work of breathing. GI: Abdomen is soft, nontender, nondistended, no abdominal masses GU: No CVA tenderness.  Lymph: No cervical or inguinal lymphadenopathy. Skin: No rashes, bruises or suspicious lesions. Neurologic: Grossly intact, no focal deficits, moving all 4 extremities. Psychiatric: Normal mood and affect.  Laboratory Data: Lab Results  Component Value Date   HGB 14.6 02/25/2008   HCT 43.0 02/25/2008    Lab Results  Component Value Date   CREATININE 0.90 02/27/2021    No results found for: "PSA"  No results found for: "TESTOSTERONE"  No results found for: "HGBA1C"  Urinalysis    Component Value Date/Time   APPEARANCEUR Clear 02/22/2022 1031   GLUCOSEU Negative 02/22/2022 1031   BILIRUBINUR Negative 02/22/2022 1031   PROTEINUR Negative 02/22/2022 1031   NITRITE Negative 02/22/2022 1031   LEUKOCYTESUR Negative 02/22/2022 1031    Lab Results  Component Value Date   LABMICR Comment 02/22/2022   WBCUA 0-5 01/21/2022   LABEPIT 0-10 01/21/2022   BACTERIA None seen 01/21/2022    Pertinent Imaging:  No results found for this or any previous visit.  No results found for this or any previous visit.  No results found for this or any previous visit.  No results found for this or any previous visit.  No results found for this or any previous visit.  No valid procedures specified. No results found for this or any previous visit.  No results found for this or any previous visit.   Assessment & Plan:    1. Balanitis Hydroxyzine '10mg'$  BID - Urinalysis, Routine w reflex microscopic  2. Scrotal pain -hydroxyzine '10mg'$  BID. Followup 3-4 weeks for scrotal biopsy    No follow-ups on file.  Nicolette Bang, MD  [facility] [place]

## 2022-05-17 NOTE — Patient Instructions (Signed)
Balanitis ? ?Balanitis is swelling and irritation of the head of the penis (glans penis). Balanitis occurs most often among males who have not had their foreskin removed (uncircumcised). In uncircumcised males, the condition may also cause inflammation of the skin around the foreskin. ?Balanitis sometimes causes scarring of the penis or foreskin, which can require surgery. This condition may develop because of an infection or another medical condition. Untreated balanitis can increase the risk of penile cancer. ?What are the causes? ?Common causes of this condition include: ?Irritation and lack of airflow due to fluid (smegma) that can build up on the glans penis. ?Poor personal hygiene, especially in uncircumcised males. Not cleaning the glans penis and foreskin well can result in a buildup of bacteria, viruses, and yeast, which can lead to infection and inflammation. ?Other causes include: ?Chemical irritation from products such as soaps or shower gels, especially those that have fragrance. Chemical irritation can also be caused by condoms, personal lubricants, petroleum jelly, spermicides, fabric softeners, or laundry detergents. ?Skin conditions, such as eczema, dermatitis, and psoriasis. ?Allergies to medicines, such as tetracycline and sulfa drugs. ?What increases the risk? ?The following factors may make you more likely to develop this condition: ?Being an uncircumcised male. ?Having diabetes. ?Having other medical conditions, including liver cirrhosis, congestive heart failure, or kidney disease. ?Having infections, such as candidiasis, HPV (human papillomavirus), herpes simplex, gonorrhea, or syphilis. ?Having a tight foreskin that is difficult to pull back (retract) past the glans penis. ?Being severely obese. ?History of reactive arthritis. ?What are the signs or symptoms? ?Symptoms of this condition include: ?Discharge from under the foreskin, and pain or difficulty retracting the foreskin. ?A bad smell  or itchiness on the penis. ?Tenderness, redness, and swelling of the glans penis. ?A rash or sores on the glans penis or foreskin. ?Inability to get an erection due to pain. ?Trouble urinating. ?Scarring of the penis or foreskin, in some cases. ?How is this diagnosed? ?This condition may be diagnosed based on a physical exam and tests of a swab of discharge to check for bacterial or fungal infection. ?You may also have blood tests to check for: ?Viruses that can cause balanitis. ?A high blood sugar (glucose) level. This could be a sign of diabetes, which can increase the risk of balanitis. ?How is this treated? ?Treatment for this condition depends on the cause. Treatment may include: ?Improving personal hygiene. Your health care provider may recommend sitting in a bath of warm water that is deep enough to cover your hips and buttocks (sitz bath). ?Medicines such as: ?Creams or ointments to reduce swelling (steroids) or to treat an infection. ?Antibiotic medicine. ?Antifungal medicine. ?Having surgery to remove or cut the foreskin (circumcision). This may be done if you have scarring on the foreskin that makes it difficult to retract. ?Controlling other medical problems that may be causing your condition or making it worse. ?Follow these instructions at home: ?Medicines ?Take over-the-counter and prescription medicines only as told by your health care provider. ?If you were prescribed an antibiotic medicine, use it as told by your health care provider. Do not stop using the antibiotic even if you start to feel better. ?General instructions ?Do not have sex until the condition clears up, or until your health care provider approves. ?Keep your penis clean and dry. Take sitz baths as recommended by your health care provider. ?Avoid products that irritate your skin or make symptoms worse, such as soaps and shower gels that have fragrance. ?Keep all follow-up visits. This is   important. ?Contact a health care provider  if: ?Your symptoms get worse or do not improve with home care. ?You develop chills or a fever. ?You have trouble urinating. ?You cannot retract your foreskin. ?Get help right away if: ?You develop severe pain. ?You are unable to urinate. ?Summary ?Balanitis is swelling and irritation of the head of the penis (glans penis). This condition is most common among uncircumcised males. ?Balanitis causes pain, redness, and swelling of the glans penis. ?Good personal hygiene is important. ?Treatment may include improving personal hygiene and applying creams or ointments. ?Contact a health care provider if your symptoms get worse or do not improve with home care. ?This information is not intended to replace advice given to you by your health care provider. Make sure you discuss any questions you have with your health care provider. ?Document Revised: 08/23/2020 Document Reviewed: 08/23/2020 ?Elsevier Patient Education ? 2023 Elsevier Inc. ? ?

## 2022-05-23 DIAGNOSIS — E663 Overweight: Secondary | ICD-10-CM | POA: Diagnosis not present

## 2022-05-23 DIAGNOSIS — R7309 Other abnormal glucose: Secondary | ICD-10-CM | POA: Diagnosis not present

## 2022-05-23 DIAGNOSIS — Z6829 Body mass index (BMI) 29.0-29.9, adult: Secondary | ICD-10-CM | POA: Diagnosis not present

## 2022-05-23 DIAGNOSIS — E039 Hypothyroidism, unspecified: Secondary | ICD-10-CM | POA: Diagnosis not present

## 2022-05-23 DIAGNOSIS — N419 Inflammatory disease of prostate, unspecified: Secondary | ICD-10-CM | POA: Diagnosis not present

## 2022-05-23 DIAGNOSIS — E119 Type 2 diabetes mellitus without complications: Secondary | ICD-10-CM | POA: Diagnosis not present

## 2022-06-05 ENCOUNTER — Ambulatory Visit: Payer: PPO | Admitting: Urology

## 2022-08-30 DIAGNOSIS — R509 Fever, unspecified: Secondary | ICD-10-CM | POA: Diagnosis not present

## 2022-08-30 DIAGNOSIS — R6889 Other general symptoms and signs: Secondary | ICD-10-CM | POA: Diagnosis not present

## 2022-08-30 DIAGNOSIS — Z20828 Contact with and (suspected) exposure to other viral communicable diseases: Secondary | ICD-10-CM | POA: Diagnosis not present

## 2022-08-30 DIAGNOSIS — Z6829 Body mass index (BMI) 29.0-29.9, adult: Secondary | ICD-10-CM | POA: Diagnosis not present

## 2022-08-30 DIAGNOSIS — E119 Type 2 diabetes mellitus without complications: Secondary | ICD-10-CM | POA: Diagnosis not present

## 2022-08-30 DIAGNOSIS — J449 Chronic obstructive pulmonary disease, unspecified: Secondary | ICD-10-CM | POA: Diagnosis not present

## 2022-08-30 DIAGNOSIS — E663 Overweight: Secondary | ICD-10-CM | POA: Diagnosis not present

## 2022-08-30 DIAGNOSIS — W57XXXA Bitten or stung by nonvenomous insect and other nonvenomous arthropods, initial encounter: Secondary | ICD-10-CM | POA: Diagnosis not present

## 2022-08-30 DIAGNOSIS — M353 Polymyalgia rheumatica: Secondary | ICD-10-CM | POA: Diagnosis not present

## 2022-11-06 DIAGNOSIS — J449 Chronic obstructive pulmonary disease, unspecified: Secondary | ICD-10-CM | POA: Diagnosis not present

## 2022-11-06 DIAGNOSIS — E119 Type 2 diabetes mellitus without complications: Secondary | ICD-10-CM | POA: Diagnosis not present

## 2022-11-06 DIAGNOSIS — Z1331 Encounter for screening for depression: Secondary | ICD-10-CM | POA: Diagnosis not present

## 2022-11-06 DIAGNOSIS — E782 Mixed hyperlipidemia: Secondary | ICD-10-CM | POA: Diagnosis not present

## 2022-11-06 DIAGNOSIS — E039 Hypothyroidism, unspecified: Secondary | ICD-10-CM | POA: Diagnosis not present

## 2022-11-06 DIAGNOSIS — E663 Overweight: Secondary | ICD-10-CM | POA: Diagnosis not present

## 2022-11-06 DIAGNOSIS — Z0001 Encounter for general adult medical examination with abnormal findings: Secondary | ICD-10-CM | POA: Diagnosis not present

## 2022-11-06 DIAGNOSIS — E7849 Other hyperlipidemia: Secondary | ICD-10-CM | POA: Diagnosis not present

## 2022-11-06 DIAGNOSIS — Z6829 Body mass index (BMI) 29.0-29.9, adult: Secondary | ICD-10-CM | POA: Diagnosis not present

## 2023-01-06 ENCOUNTER — Other Ambulatory Visit: Payer: PPO

## 2023-01-06 DIAGNOSIS — R972 Elevated prostate specific antigen [PSA]: Secondary | ICD-10-CM

## 2023-01-07 LAB — PSA: Prostate Specific Ag, Serum: 3.4 ng/mL (ref 0.0–4.0)

## 2023-01-16 DIAGNOSIS — Z6829 Body mass index (BMI) 29.0-29.9, adult: Secondary | ICD-10-CM | POA: Diagnosis not present

## 2023-01-16 DIAGNOSIS — R6889 Other general symptoms and signs: Secondary | ICD-10-CM | POA: Diagnosis not present

## 2023-01-16 DIAGNOSIS — R002 Palpitations: Secondary | ICD-10-CM | POA: Diagnosis not present

## 2023-01-16 DIAGNOSIS — M353 Polymyalgia rheumatica: Secondary | ICD-10-CM | POA: Diagnosis not present

## 2023-01-16 DIAGNOSIS — R0600 Dyspnea, unspecified: Secondary | ICD-10-CM | POA: Diagnosis not present

## 2023-01-16 DIAGNOSIS — J449 Chronic obstructive pulmonary disease, unspecified: Secondary | ICD-10-CM | POA: Diagnosis not present

## 2023-01-16 DIAGNOSIS — J329 Chronic sinusitis, unspecified: Secondary | ICD-10-CM | POA: Diagnosis not present

## 2023-01-16 DIAGNOSIS — E663 Overweight: Secondary | ICD-10-CM | POA: Diagnosis not present

## 2023-01-17 ENCOUNTER — Other Ambulatory Visit (HOSPITAL_COMMUNITY): Payer: Self-pay | Admitting: Internal Medicine

## 2023-01-17 ENCOUNTER — Ambulatory Visit: Payer: PPO | Admitting: Urology

## 2023-01-17 ENCOUNTER — Ambulatory Visit (HOSPITAL_COMMUNITY)
Admission: RE | Admit: 2023-01-17 | Discharge: 2023-01-17 | Disposition: A | Discharge status: Home / Self Care | Payer: PPO | Source: Ambulatory Visit | Attending: Internal Medicine | Admitting: Internal Medicine

## 2023-01-17 VITALS — BP 121/64 | HR 76

## 2023-01-17 DIAGNOSIS — N401 Enlarged prostate with lower urinary tract symptoms: Secondary | ICD-10-CM | POA: Diagnosis not present

## 2023-01-17 DIAGNOSIS — N481 Balanitis: Secondary | ICD-10-CM | POA: Diagnosis not present

## 2023-01-17 DIAGNOSIS — R351 Nocturia: Secondary | ICD-10-CM

## 2023-01-17 DIAGNOSIS — J40 Bronchitis, not specified as acute or chronic: Secondary | ICD-10-CM | POA: Diagnosis not present

## 2023-01-17 DIAGNOSIS — R0600 Dyspnea, unspecified: Secondary | ICD-10-CM

## 2023-01-17 DIAGNOSIS — R972 Elevated prostate specific antigen [PSA]: Secondary | ICD-10-CM

## 2023-01-17 LAB — URINALYSIS, ROUTINE W REFLEX MICROSCOPIC
Bilirubin, UA: NEGATIVE
Ketones, UA: NEGATIVE
Leukocytes,UA: NEGATIVE
Nitrite, UA: NEGATIVE
Protein,UA: NEGATIVE
RBC, UA: NEGATIVE
Specific Gravity, UA: 1.015 (ref 1.005–1.030)
Urobilinogen, Ur: 0.2 mg/dL (ref 0.2–1.0)
pH, UA: 6 (ref 5.0–7.5)

## 2023-01-17 MED ORDER — ALFUZOSIN HCL ER 10 MG PO TB24: 10.0000 mg | ORAL_TABLET | Freq: Two times a day (BID) | ORAL | 3 refills | Status: DC

## 2023-01-17 MED ORDER — FLUCONAZOLE 100 MG PO TABS: 100.0000 mg | ORAL_TABLET | Freq: Every day | ORAL | 0 refills | Status: DC

## 2023-01-17 MED ORDER — NYSTATIN-TRIAMCINOLONE 100000-0.1 UNIT/GM-% EX OINT: 1.0000 | TOPICAL_OINTMENT | Freq: Two times a day (BID) | CUTANEOUS | 3 refills | Status: AC

## 2023-01-17 NOTE — Progress Notes (Unsigned)
01/17/2023 10:14 AM   Gardiner Coins Ponti [birth year] [id number]  Referring provider: Assunta Found, MD [address] [place],  [place] [address]   Elevated PSa and scrotal itching  HPI: [name] is a 72yo here for followup for elevated PSa and scrotal itching. He has worsening candida on his scrotum and perineum. He ran out of his mycolog cream. The cream improved the irritation but it returns soon after stopping the cream. PSA increased to 3.4 from 2.6. No worsening LUTS   PMH: Past Medical History:  Diagnosis Date   GERD (gastroesophageal reflux disease)    Hypothyroidism    Lumbar vertebral fracture (HCC)    L3    Surgical History: Past Surgical History:  Procedure Laterality Date   ADENOIDECTOMY     COLONOSCOPY N/A 09/22/2012   Procedure: COLONOSCOPY;  Surgeon: Dalia Heading, MD;  Location: [facility];  Service: Gastroenterology;  Laterality: N/A;   ESOPHAGOGASTRODUODENOSCOPY (EGD) WITH ESOPHAGEAL DILATION N/A 09/22/2012   Procedure: ESOPHAGOGASTRODUODENOSCOPY (EGD) WITH POSSIBLE ESOPHAGEAL DILATION;  Surgeon: Dalia Heading, MD;  Location: [facility];  Service: Gastroenterology;  Laterality: N/A;   THUMB AMPUTATION Left 2018   TONSILLECTOMY      Home Medications:  Allergies as of 01/17/2023       Reactions   Latex Rash        Medication List        Accurate as of January 17, 2023 10:14 AM. If you have any questions, ask your nurse or doctor.          alfuzosin 10 MG 24 hr tablet Commonly known as: UROXATRAL Take 1 tablet (10 mg total) by mouth in the morning and at bedtime.   aspirin EC 81 MG tablet Take 81 mg by mouth daily.   clotrimazole-betamethasone cream Commonly known as: Lotrisone Apply 1 Application topically 2 (two) times daily.   doxycycline 100 MG capsule Commonly known as: VIBRAMYCIN Take 1 capsule (100 mg total) by mouth every 12 (twelve) hours.   fluconazole 100 MG tablet Commonly known as: DIFLUCAN Take 1 tablet (100 mg  total) by mouth daily. X 7 days   fluconazole 100 MG tablet Commonly known as: DIFLUCAN Take 1 tablet (100 mg total) by mouth daily. X 7 days   Glucosamine Sulfate 500 MG Tabs Take by mouth.   glucosamine-chondroitin 500-400 MG tablet Take 1 tablet by mouth 3 (three) times daily.   hydrOXYzine 10 MG tablet Commonly known as: ATARAX Take 1 tablet (10 mg total) by mouth in the morning and at bedtime.   levothyroxine 100 MCG tablet Commonly known as: SYNTHROID Take 100 mcg by mouth daily before breakfast.   multivitamin with minerals Tabs tablet Take 1 tablet by mouth every other day.   nystatin-triamcinolone ointment Commonly known as: MYCOLOG Apply 1 Application topically 2 (two) times daily.   omeprazole 40 MG capsule Commonly known as: PRILOSEC Take by mouth.   ondansetron 4 MG tablet Commonly known as: ZOFRAN Take 4-8 mg by mouth every 6 (six) hours as needed.   pantoprazole 20 MG tablet Commonly known as: PROTONIX Take by mouth.   rosuvastatin 10 MG tablet Commonly known as: CRESTOR Take 10 mg by mouth at bedtime.   Spiriva Respimat 2.5 MCG/ACT Aers Generic drug: Tiotropium Bromide Monohydrate SMARTSIG:2 Puff(s) Via Inhaler Daily   sulfamethoxazole-trimethoprim 800-160 MG tablet Commonly known as: BACTRIM DS Take 1 tablet by mouth every 12 (twelve) hours.   valACYclovir 1000 MG tablet Commonly known as: Valtrex Take 1 tablet (  1,000 mg total) by mouth 2 (two) times daily.   vardenafil 20 MG tablet Commonly known as: LEVITRA Take 1 tablet (20 mg total) by mouth daily as needed for erectile dysfunction.        Allergies:  Allergies  Allergen Reactions   Latex Rash    Family History: Family History  Problem Relation Age of Onset   Diabetes Mellitus II Mother    Cerebral aneurysm Father     Social History:  reports that he has quit smoking. His smoking use included cigarettes. He has never used smokeless tobacco. He reports current alcohol  use. He reports that he does not use drugs.  ROS: All other review of systems were reviewed and are negative except what is noted above in HPI  Physical Exam: BP 121/64   Pulse 76   Constitutional:  Alert and oriented, No acute distress. HEENT: [place] AT, moist mucus membranes.  Trachea midline, no masses. Cardiovascular: No clubbing, cyanosis, or edema. Respiratory: Normal respiratory effort, no increased work of breathing. GI: Abdomen is soft, nontender, nondistended, no abdominal masses GU: No CVA tenderness.  Lymph: No cervical or inguinal lymphadenopathy. Skin: No rashes, bruises or suspicious lesions. Neurologic: Grossly intact, no focal deficits, moving all 4 extremities. Psychiatric: Normal mood and affect.  Laboratory Data: Lab Results  Component Value Date   HGB 14.6 02/25/2008   HCT 43.0 02/25/2008    Lab Results  Component Value Date   CREATININE 0.90 02/27/2021    No results found for: "PSA"  No results found for: "TESTOSTERONE"  No results found for: "HGBA1C"  Urinalysis    Component Value Date/Time   APPEARANCEUR Clear 05/17/2022 1302   GLUCOSEU Negative 05/17/2022 1302   BILIRUBINUR Negative 05/17/2022 1302   PROTEINUR Negative 05/17/2022 1302   NITRITE Negative 05/17/2022 1302   LEUKOCYTESUR Negative 05/17/2022 1302    Lab Results  Component Value Date   LABMICR Comment 05/17/2022   WBCUA 0-5 01/21/2022   LABEPIT 0-10 01/21/2022   BACTERIA None seen 01/21/2022    Pertinent Imaging:  No results found for this or any previous visit.  No results found for this or any previous visit.  No results found for this or any previous visit.  No results found for this or any previous visit.  No results found for this or any previous visit.  No valid procedures specified. No results found for this or any previous visit.  No results found for this or any previous visit.   Assessment & Plan:    1. Balanitis Continue mycolog cream  PRN -dermatology referral - Urinalysis, Routine w reflex microscopic  2. Benign localized prostatic hyperplasia with lower urinary tract symptoms (LUTS) -continue uroxatal 10mg  qhs  3. Nocturia Continue uroxatral 10mg  qhs  4. Elevated PSA -followup 1 year with PSA   No follow-ups on file.  Wilkie Aye, MD  [facility] [place]

## 2023-01-19 ENCOUNTER — Encounter: Payer: Self-pay | Admitting: Urology

## 2023-01-19 NOTE — Patient Instructions (Signed)

## 2023-02-11 ENCOUNTER — Ambulatory Visit: Payer: PPO | Admitting: Internal Medicine

## 2023-02-11 ENCOUNTER — Encounter: Payer: Self-pay | Admitting: Internal Medicine

## 2023-02-11 VITALS — BP 151/70 | HR 75 | Ht 72.0 in | Wt 211.0 lb

## 2023-02-11 DIAGNOSIS — R0609 Other forms of dyspnea: Secondary | ICD-10-CM | POA: Diagnosis not present

## 2023-02-11 DIAGNOSIS — R079 Chest pain, unspecified: Secondary | ICD-10-CM

## 2023-02-11 MED ORDER — METOPROLOL TARTRATE 25 MG PO TABS: 25.0000 mg | ORAL_TABLET | Freq: Two times a day (BID) | ORAL | 11 refills | Status: DC

## 2023-02-11 MED ORDER — FAMOTIDINE 20 MG PO TABS: ORAL_TABLET | ORAL | 11 refills | Status: AC

## 2023-02-11 MED ORDER — FAMOTIDINE 20 MG PO TABS: ORAL_TABLET | ORAL | 11 refills | Status: DC

## 2023-02-11 NOTE — Assessment & Plan Note (Addendum)
Onset around early Oct 2024 assoc with heart pounding s/p smoking cessation in 2006  -  EGD 09/22/12  medium HH  - 02/11/2023   Walked on RA  x  3  lap(s) =  approx 450  ft  @ fast pace, stopped due to end of study  with lowest 02 sats 94%  - 02/11/2023 rec lopressor 25 mg bid and max gerd rx / stop advil and spiriva    When lower respiratory symptoms begin or become refractory well after a patient reports complete smoking cessation,  especially when this wasn't the case while they were smoking, a red flag is raised based on the work of Dr Primitivo Gauze which states:if you quit smoking when your best day FEV1 is still well preserved it is highly unlikely you will progress to severe disease.  That is to say, once the smoking stops,  the symptoms should not suddenly erupt or markedly worsen.  If so, the differential diagnosis should include  obesity/deconditioning,  LPR/Reflux/Aspiration syndromes,  occult CHF, or  especially side effect of medications commonly used in this population.    By process of elimination,  obesity and LPR seem like the most likely source of his "chest" symptoms at this point   DDX  = almost all start with A and  include Adherence, Ace Inhibitors, Acid Reflux, Active Sinus Disease, Alpha 1 Antitripsin deficiency, Anxiety masquerading as Airways dz,  ABPA,  Allergy(esp in young), Aspiration (esp in elderly), Adverse effects of meds,  Active smoking or Vaping, A bunch of PE's/clot burden (a few small clots can't cause this syndrome unless there is already severe underlying pulm or vascular dz with poor reserve),  Anemia or thyroid disorder, plus two Bs  = Bronchiectasis and Beta blocker use..and one C= CHF  Labs are pending to rule out all bolded dx's  Rec: Complete cards eval as planned and in meantime try metaprolol low dose = 25 mg bid for heart pounding sensation Max gerd rx/ diet reviewed   Pulmonary f/u is prn   Each maintenance medication was reviewed in detail including  emphasizing most importantly the difference between maintenance and prns and under what circumstances the prns are to be triggered using an action plan format where appropriate.  Total time for H and P, chart review, counseling, reviewing smi(respimat)  device(s) , directly observing portions of ambulatory 02 saturation study/ and generating customized AVS unique to this office visit / same day charting = 60 min new pt eval

## 2023-02-11 NOTE — Patient Instructions (Addendum)
Please remember to go to the lab department   for your tests - we will call you with the results when they are available.      Pace yourself slow than you did today pending cardiology evaluation.   Metaprolol 25 mg twice daily   Stop advil and spiriva   Continue  prilosec  40mg   Take 30-60 min before first meal of the day and Pepcid ac (famotidine) 20 mg one after supper    GERD (REFLUX)  is an extremely common cause of respiratory symptoms just like yours , many times with no obvious heartburn at all.    It can be treated with medication, but also with lifestyle changes including elevation of the head of your bed (ideally with 6 -8inch blocks under the headboard of your bed),  Smoking cessation, avoidance of late meals, excessive alcohol, and avoid fatty foods, chocolate, peppermint, colas, red wine, and acidic juices such as orange juice.  NO MINT OR MENTHOL PRODUCTS SO NO COUGH DROPS  USE SUGARLESS CANDY INSTEAD (Jolley ranchers or Stover's or Life Savers) or even ice chips will also do - the key is to swallow to prevent all throat clearing. NO OIL BASED VITAMINS - use powdered substitutes.  Avoid fish oil when coughing.      Pulmonary follow up as needed

## 2023-02-11 NOTE — Progress Notes (Signed)
Lucas Leach, male    DOB: [birth year]    MRN: [id number]   Brief patient profile:  69   yowm quit smoker 2006 around wt  180  lb  with mild doe but seemed all  better  p quit, very physical work tolerance for years p quit referred to pulmonary clinic in [place]  02/11/2023 by Dr Sherwood Gambler  for doe / palpitations    History of Present Illness  02/11/2023  Pulmonary/ 1st office eval/ Sherene Sires / [facility]  Chief Complaint  Patient presents with   Establish Care   Shortness of Breath  Dyspnea:  150 ft slt [place]  to [place] suddenly more difficult to get back to house  onset about a week p 1st noted resting symptoms assoc with heart pounding  early Oct  Cough:  new cough x 6 months / assoc with sense of pnds and nasal congestion  Sleep: bed is flat/ one pillow wakes up nose stopped up  and heart pounding once or twice a night consistently  SABA use: none  02:  none  Advil or belching make "chest pounding" feel much better  No better with spiriva  No obvious day to day or daytime pattern/variability or assoc excess/ purulent sputum or mucus plugs or hemoptysis or cp or chest tightness, subjective wheeze or overt sinus or hb symptoms.    Also denies any obvious fluctuation of symptoms with weather or environmental changes or other aggravating or alleviating factors except as outlined above   No unusual exposure hx or h/o childhood pna/ asthma or knowledge of premature birth.  Current Allergies, Complete Past Medical History, Past Surgical History, Family History, and Social History were reviewed in [employer] record.  ROS  The following are not active complaints unless bolded Hoarseness, sore throat, dysphagia, dental problems, itching, sneezing,  nasal congestion or sense of discharge of excess mucus or purulent secretions, ear ache,   fever, chills, sweats, unintended wt loss or wt gain, classically pleuritic or exertional cp,  orthopnea pnd or arm/hand swelling   or leg swelling, presyncope, palpitations, abdominal pain, anorexia, nausea, vomiting, diarrhea  or change in bowel habits or change in bladder habits, change in stools or change in urine, dysuria, hematuria,  rash, arthralgias, visual complaints, headache, numbness, weakness or ataxia or problems with walking or coordination,  change in mood or  memory.             Outpatient Medications Prior to Visit - - NOTE:   Unable to verify as accurately reflecting what pt takes    Medication Sig Dispense Refill   alfuzosin (UROXATRAL) 10 MG 24 hr tablet Take 1 tablet (10 mg total) by mouth in the morning and at bedtime. 180 tablet 3   aspirin EC 81 MG tablet Take 81 mg by mouth daily.     clotrimazole-betamethasone (LOTRISONE) cream Apply 1 Application topically 2 (two) times daily. 30 g 3   glucosamine-chondroitin 500-400 MG tablet Take 1 tablet by mouth 3 (three) times daily.     levothyroxine (SYNTHROID) 100 MCG tablet Take 100 mcg by mouth daily before breakfast.     Multiple Vitamin (MULTIVITAMIN WITH MINERALS) TABS Take 1 tablet by mouth every other day.     nystatin-triamcinolone ointment (MYCOLOG) Apply 1 Application topically 2 (two) times daily. 30 g 3   omeprazole (PRILOSEC) 40 MG capsule Take by mouth.     rosuvastatin (CRESTOR) 10 MG tablet Take 10 mg by mouth at bedtime.  SPIRIVA RESPIMAT 2.5 MCG/ACT AERS Inhale 1 puff into the lungs daily.     traZODone (DESYREL) 100 MG tablet Take 100 mg by mouth at bedtime.     valACYclovir (VALTREX) 1000 MG tablet Take 1 tablet (1,000 mg total) by mouth 2 (two) times daily. 20 tablet 0   cloNIDine (CATAPRES) 0.1 MG tablet Take 0.1 mg by mouth daily.     loperamide (IMODIUM A-D) 2 MG tablet Take 2 mg by mouth as needed.     fluconazole (DIFLUCAN) 100 MG tablet Take 1 tablet (100 mg total) by mouth daily. X 7 days (Patient not taking: Reported on 01/17/2023) 7 tablet 0   fluconazole (DIFLUCAN) 100 MG tablet Take 1 tablet (100 mg total) by mouth  daily. X 7 days 7 tablet 0   hydrOXYzine (ATARAX) 10 MG tablet Take 1 tablet (10 mg total) by mouth in the morning and at bedtime. 60 tablet 0   ondansetron (ZOFRAN) 4 MG tablet Take 4-8 mg by mouth every 6 (six) hours as needed. (Patient not taking: Reported on 01/17/2023)     sulfamethoxazole-trimethoprim (BACTRIM DS) 800-160 MG tablet Take 1 tablet by mouth every 12 (twelve) hours. (Patient not taking: Reported on 01/21/2022) 56 tablet 0   vardenafil (LEVITRA) 20 MG tablet Take 1 tablet (20 mg total) by mouth daily as needed for erectile dysfunction. (Patient not taking: Reported on 01/17/2023) 30 tablet 5   No facility-administered medications prior to visit.    Past Medical History:  Diagnosis Date   GERD (gastroesophageal reflux disease)    Hypothyroidism    Lumbar vertebral fracture (HCC)    L3      Objective:     BP (!) 151/70   Pulse 75   Ht 6' (1.829 m)   Wt 211 lb (95.7 kg)   SpO2 96%   BMI 28.62 kg/m   SpO2: 96 % RA  Anxious wm/ no heart pounding today, has not walked to MB yet nor taken his advil.    HEENT : Oropharynx  clear      Nasal turbinates nl    NECK :  without  apparent JVD/ palpable Nodes/TM    LUNGS: no acc muscle use,  Nl contour chest which is clear to A and P bilaterally without cough on insp or exp maneuvers   CV:  RRR  no s3 or murmur or increase in P2, and no edema   ABD:  obese soft and nontender with nl inspiratory excursion in the supine position. No bruits or organomegaly appreciated   MS:  Nl gait/ ext warm without deformities Or obvious joint restrictions  calf tenderness, cyanosis or clubbing    SKIN: warm and dry without lesions    NEURO:  alert, approp, nl sensorium with  no motor or cerebellar deficits apparent.       I personally reviewed images and agree with radiology impression as follows:  CXR:   pa and lateral  No active cardiopulmonary disease. Mild bronchitic changes.  EKG p walking 450 ft fast 02/11/2023 assoc  with heart pounding at end    SR at 65 s ischemic changes    Assessment   DOE (dyspnea on exertion) Onset around early Oct 2024 assoc with heart pounding s/p smoking cessation in 2006  -  EGD 09/22/12  medium HH  - 02/11/2023   Walked on RA  x  3  lap(s) =  approx 450  ft  @ fast pace, stopped due to end of study  with  lowest 02 sats 94%  - 02/11/2023 rec lopressor 25 mg bid and max gerd rx / stop advil and spiriva    When lower respiratory symptoms begin or become refractory well after a patient reports complete smoking cessation,  especially when this wasn't the case while they were smoking, a red flag is raised based on the work of Dr Primitivo Gauze which states:if you quit smoking when your best day FEV1 is still well preserved it is highly unlikely you will progress to severe disease.  That is to say, once the smoking stops,  the symptoms should not suddenly erupt or markedly worsen.  If so, the differential diagnosis should include  obesity/deconditioning,  LPR/Reflux/Aspiration syndromes,  occult CHF, or  especially side effect of medications commonly used in this population.    By process of elimination,  obesity and LPR seem like the most likely source of his "chest" symptoms at this point   DDX  = almost all start with A and  include Adherence, Ace Inhibitors, Acid Reflux, Active Sinus Disease, Alpha 1 Antitripsin deficiency, Anxiety masquerading as Airways dz,  ABPA,  Allergy(esp in young), Aspiration (esp in elderly), Adverse effects of meds,  Active smoking or Vaping, A bunch of PE's/clot burden (a few small clots can't cause this syndrome unless there is already severe underlying pulm or vascular dz with poor reserve),  Anemia or thyroid disorder, plus two Bs  = Bronchiectasis and Beta blocker use..and one C= CHF  Labs are pending to rule out all bolded dx's  Rec: Complete cards eval as planned and in meantime try metaprolol low dose = 25 mg bid for heart pounding sensation Max gerd rx/  diet reviewed   Pulmonary f/u is prn   Each maintenance medication was reviewed in detail including emphasizing most importantly the difference between maintenance and prns and under what circumstances the prns are to be triggered using an action plan format where appropriate.  Total time for H and P, chart review, counseling, reviewing smi(respimat)  device(s) , directly observing portions of ambulatory 02 saturation study/ and generating customized AVS unique to this office visit / same day charting = 60 min new pt eval                   Sandrea Hughs, MD 02/11/2023

## 2023-02-15 LAB — CBC WITH DIFFERENTIAL/PLATELET
Basophils Absolute: 0 10*3/uL (ref 0.0–0.2)
Basos: 1 %
EOS (ABSOLUTE): 0.2 10*3/uL (ref 0.0–0.4)
Eos: 3 %
Hematocrit: 44.8 % (ref 37.5–51.0)
Hemoglobin: 15.1 g/dL (ref 13.0–17.7)
Immature Grans (Abs): 0 10*3/uL (ref 0.0–0.1)
Immature Granulocytes: 0 %
Lymphocytes Absolute: 1.4 10*3/uL (ref 0.7–3.1)
Lymphs: 24 %
MCH: 30.9 pg (ref 26.6–33.0)
MCHC: 33.7 g/dL (ref 31.5–35.7)
MCV: 92 fL (ref 79–97)
Monocytes Absolute: 0.4 10*3/uL (ref 0.1–0.9)
Monocytes: 7 %
Neutrophils Absolute: 3.9 10*3/uL (ref 1.4–7.0)
Neutrophils: 65 %
Platelets: 202 10*3/uL (ref 150–450)
RBC: 4.88 x10E6/uL (ref 4.14–5.80)
RDW: 11.8 % (ref 11.6–15.4)
WBC: 5.9 10*3/uL (ref 3.4–10.8)

## 2023-02-15 LAB — ALPHA-1-ANTITRYPSIN PHENOTYP: A-1 Antitrypsin: 128 mg/dL (ref 101–187)

## 2023-02-15 LAB — BASIC METABOLIC PANEL
BUN/Creatinine Ratio: 20 (ref 10–24)
BUN: 19 mg/dL (ref 8–27)
CO2: 23 mmol/L (ref 20–29)
Calcium: 9.2 mg/dL (ref 8.6–10.2)
Chloride: 103 mmol/L (ref 96–106)
Creatinine, Ser: 0.96 mg/dL (ref 0.76–1.27)
Glucose: 112 mg/dL — ABNORMAL HIGH (ref 70–99)
Potassium: 4.2 mmol/L (ref 3.5–5.2)
Sodium: 140 mmol/L (ref 134–144)
eGFR: 83 mL/min/{1.73_m2} (ref >59)

## 2023-02-15 LAB — SEDIMENTATION RATE: Sed Rate: 4 mm/h (ref 0–30)

## 2023-02-15 LAB — TSH: TSH: 1.86 u[IU]/mL (ref 0.450–4.500)

## 2023-02-15 LAB — D-DIMER, QUANTITATIVE: D-DIMER: 0.37 mg{FEU}/L (ref 0.00–0.49)

## 2023-02-15 LAB — BRAIN NATRIURETIC PEPTIDE: BNP: 27.2 pg/mL (ref 0.0–100.0)

## 2023-02-15 LAB — IGE: IgE (Immunoglobulin E), Serum: 505 [IU]/mL — ABNORMAL HIGH (ref 6–495)

## 2023-02-17 ENCOUNTER — Encounter: Payer: Self-pay | Admitting: *Deleted

## 2023-03-12 ENCOUNTER — Institutional Professional Consult (permissible substitution): Payer: PPO | Admitting: Internal Medicine

## 2023-04-11 ENCOUNTER — Telehealth: Payer: Self-pay | Admitting: *Deleted

## 2023-04-11 MED ORDER — METOPROLOL TARTRATE 25 MG PO TABS: 25.0000 mg | ORAL_TABLET | Freq: Two times a day (BID) | ORAL | 3 refills | Status: AC

## 2023-04-11 NOTE — Telephone Encounter (Signed)
Rx was sent  I spoke with the pt and notified him that this was done  Nothing further needed

## 2023-04-11 NOTE — Telephone Encounter (Signed)
Patient came by the desk and would like his prescription of Metoprolol Tartrate 25mg  to be a 90 day supply . He is almost out of his prescription  [facility]   Call if there's any problems [phone]

## 2023-10-06 ENCOUNTER — Telehealth: Payer: Self-pay | Admitting: Urology

## 2023-10-06 ENCOUNTER — Ambulatory Visit: Payer: Self-pay

## 2023-10-06 NOTE — Telephone Encounter (Signed)
 Patient contacted, he stated that he meant to call his urologist. Advised to call back if he has any questions for [facility]. No further needs at this time.  Copied from CRM [id number]. Topic: Clinical - Red Word Triage >> Oct 06, 2023  9:34 AM Suzen RAMAN wrote: Red Word that prompted transfer to Nurse Triage: Groin pain

## 2023-10-06 NOTE — Telephone Encounter (Signed)
 Having groin pain and wants to see Sherrilee, I offered Lauraine

## 2023-10-06 NOTE — Telephone Encounter (Signed)
 Called Mr. Clymer to get a description of pain lvm to c/b if he wants to schedule appt w/ NP for sooner appt

## 2023-10-07 ENCOUNTER — Telehealth: Payer: Self-pay

## 2023-10-07 DIAGNOSIS — N400 Enlarged prostate without lower urinary tract symptoms: Secondary | ICD-10-CM | POA: Diagnosis not present

## 2023-10-07 DIAGNOSIS — E785 Hyperlipidemia, unspecified: Secondary | ICD-10-CM | POA: Diagnosis not present

## 2023-10-07 DIAGNOSIS — Z1389 Encounter for screening for other disorder: Secondary | ICD-10-CM | POA: Diagnosis not present

## 2023-10-07 DIAGNOSIS — I10 Essential (primary) hypertension: Secondary | ICD-10-CM | POA: Diagnosis not present

## 2023-10-07 DIAGNOSIS — Z6828 Body mass index (BMI) 28.0-28.9, adult: Secondary | ICD-10-CM | POA: Diagnosis not present

## 2023-10-07 DIAGNOSIS — Z0001 Encounter for general adult medical examination with abnormal findings: Secondary | ICD-10-CM | POA: Diagnosis not present

## 2023-10-07 DIAGNOSIS — E039 Hypothyroidism, unspecified: Secondary | ICD-10-CM | POA: Diagnosis not present

## 2023-10-07 DIAGNOSIS — Z1331 Encounter for screening for depression: Secondary | ICD-10-CM | POA: Diagnosis not present

## 2023-10-07 NOTE — Telephone Encounter (Signed)
 Called pt to schedule appt due to symptoms of severe burning, aching and tenderness to from scrotum through the perineum to the anus. Pt also states he's taken Advil, ibuprofen and topical lidocaine  with no relief pt schedule per Alan for 07/16 @10 :20 am pt confirmed appt

## 2023-10-08 ENCOUNTER — Encounter: Payer: Self-pay | Admitting: Urology

## 2023-10-08 ENCOUNTER — Ambulatory Visit: Admitting: Urology

## 2023-10-08 VITALS — BP 127/76 | HR 57

## 2023-10-08 DIAGNOSIS — N411 Chronic prostatitis: Secondary | ICD-10-CM

## 2023-10-08 LAB — URINALYSIS, ROUTINE W REFLEX MICROSCOPIC
Bilirubin, UA: NEGATIVE
Glucose, UA: NEGATIVE
Ketones, UA: NEGATIVE
Leukocytes,UA: NEGATIVE
Nitrite, UA: NEGATIVE
Protein,UA: NEGATIVE
RBC, UA: NEGATIVE
Specific Gravity, UA: 1.015 (ref 1.005–1.030)
Urobilinogen, Ur: 0.2 mg/dL (ref 0.2–1.0)
pH, UA: 6.5 (ref 5.0–7.5)

## 2023-10-08 MED ORDER — DOXYCYCLINE HYCLATE 100 MG PO CAPS: 100.0000 mg | ORAL_CAPSULE | Freq: Two times a day (BID) | ORAL | 0 refills | Status: AC

## 2023-10-08 NOTE — Addendum Note (Signed)
 Addended by: Cova Knieriem L on: 10/08/2023 01:18 PM   Modules accepted: Orders

## 2023-10-08 NOTE — Progress Notes (Signed)
 10/08/2023 10:43 AM   Lucas Leach January 18, 1951 [id number]  Referring provider: Marvine Rush, MD [address] [place],  [place] [address]  Pelvic pain   HPI: Mr Tonne is a 73yo here for followup for chronic prostatitis. Starting 2 weeks ago he noticed increased dysuria, bilateral testicular pain and perineal pain. The pain has been increasing in severity over the past 2 weeks. No worsening LUTS since he has been on uroxatral  10mg . No straining to urinate.     PMH: Past Medical History:  Diagnosis Date   GERD (gastroesophageal reflux disease)    Hypothyroidism    Lumbar vertebral fracture (HCC)    L3    Surgical History: Past Surgical History:  Procedure Laterality Date   ADENOIDECTOMY     COLONOSCOPY N/A 09/22/2012   Procedure: COLONOSCOPY;  Surgeon: Oneil DELENA Budge, MD;  [facility];  Service: Gastroenterology;  Laterality: N/A;   ESOPHAGOGASTRODUODENOSCOPY (EGD) WITH ESOPHAGEAL DILATION N/A 09/22/2012   Procedure: ESOPHAGOGASTRODUODENOSCOPY (EGD) WITH POSSIBLE ESOPHAGEAL DILATION;  Surgeon: Oneil DELENA Budge, MD;  [facility];  Service: Gastroenterology;  Laterality: N/A;   THUMB AMPUTATION Left 2018   TONSILLECTOMY      Home Medications:  Allergies as of 10/08/2023       Reactions   Latex Rash        Medication List        Accurate as of October 08, 2023 10:43 AM. If you have any questions, ask your nurse or doctor.          alfuzosin  10 MG 24 hr tablet Commonly known as: UROXATRAL  Take 1 tablet (10 mg total) by mouth in the morning and at bedtime.   aspirin EC 81 MG tablet Take 81 mg by mouth daily.   clotrimazole -betamethasone  cream Commonly known as: Lotrisone  Apply 1 Application topically 2 (two) times daily.   famotidine  20 MG tablet Commonly known as: Pepcid  One after supper   glucosamine-chondroitin 500-400 MG tablet Take 1 tablet by mouth 3 (three) times daily.   levothyroxine 100 MCG tablet Commonly known as:  SYNTHROID Take 100 mcg by mouth daily before breakfast.   metoprolol  tartrate 25 MG tablet Commonly known as: LOPRESSOR  Take 1 tablet (25 mg total) by mouth 2 (two) times daily.   multivitamin with minerals Tabs tablet Take 1 tablet by mouth every other day.   nystatin -triamcinolone  ointment Commonly known as: MYCOLOG Apply 1 Application topically 2 (two) times daily.   omeprazole  40 MG capsule Commonly known as: PRILOSEC Take by mouth.   rosuvastatin 10 MG tablet Commonly known as: CRESTOR Take 10 mg by mouth at bedtime.   Spiriva Respimat 2.5 MCG/ACT Aers Generic drug: Tiotropium Bromide Monohydrate Inhale 1 puff into the lungs daily.   traZODone 100 MG tablet Commonly known as: DESYREL Take 100 mg by mouth at bedtime.   valACYclovir  1000 MG tablet Commonly known as: Valtrex  Take 1 tablet (1,000 mg total) by mouth 2 (two) times daily.        Allergies:  Allergies  Allergen Reactions   Latex Rash    Family History: Family History  Problem Relation Age of Onset   Diabetes Mellitus II Mother    Cerebral aneurysm Father     Social History:  reports that he has quit smoking. His smoking use included cigarettes. He has never used smokeless tobacco. He reports current alcohol use. He reports that he does not use drugs.  ROS: All other review of systems were reviewed and are negative except  what is noted above in HPI  Physical Exam: BP 127/76   Pulse (!) 57   Constitutional:  Alert and oriented, No acute distress. HEENT: [place] AT, moist mucus membranes.  Trachea midline, no masses. Cardiovascular: No clubbing, cyanosis, or edema. Respiratory: Normal respiratory effort, no increased work of breathing. GI: Abdomen is soft, nontender, nondistended, no abdominal masses GU: No CVA tenderness.  Bilateral epididymal pain. Lymph: No cervical or inguinal lymphadenopathy. Skin: No rashes, bruises or suspicious lesions. Neurologic: Grossly intact, no focal deficits,  moving all 4 extremities. Psychiatric: Normal mood and affect.  Laboratory Data: Lab Results  Component Value Date   WBC 5.9 02/11/2023   HGB 15.1 02/11/2023   HCT 44.8 02/11/2023   MCV 92 02/11/2023   PLT 202 02/11/2023    Lab Results  Component Value Date   CREATININE 0.96 02/11/2023    No results found for: PSA  No results found for: TESTOSTERONE  No results found for: HGBA1C  Urinalysis    Component Value Date/Time   APPEARANCEUR Clear 01/17/2023 0944   GLUCOSEU Trace (A) 01/17/2023 0944   BILIRUBINUR Negative 01/17/2023 0944   PROTEINUR Negative 01/17/2023 0944   NITRITE Negative 01/17/2023 0944   LEUKOCYTESUR Negative 01/17/2023 0944    Lab Results  Component Value Date   LABMICR Comment 01/17/2023   WBCUA 0-5 01/21/2022   LABEPIT 0-10 01/21/2022   BACTERIA None seen 01/21/2022    Pertinent Imaging:  No results found for this or any previous visit.  No results found for this or any previous visit.  No results found for this or any previous visit.  No results found for this or any previous visit.  No results found for this or any previous visit.  No results found for this or any previous visit.  No results found for this or any previous visit.  No results found for this or any previous visit.   Assessment & Plan:    1. Chronic prostatitis without hematuria (Primary) Doxycycline  100mg  BID for 28 days - Urinalysis, Routine w reflex microscopic   No follow-ups on file.  Belvie Clara, MD  [facility] [place]

## 2023-10-08 NOTE — Patient Instructions (Signed)

## 2023-10-10 DIAGNOSIS — E785 Hyperlipidemia, unspecified: Secondary | ICD-10-CM | POA: Diagnosis not present

## 2023-10-10 DIAGNOSIS — Z131 Encounter for screening for diabetes mellitus: Secondary | ICD-10-CM | POA: Diagnosis not present

## 2023-10-10 DIAGNOSIS — I10 Essential (primary) hypertension: Secondary | ICD-10-CM | POA: Diagnosis not present

## 2023-10-10 DIAGNOSIS — E039 Hypothyroidism, unspecified: Secondary | ICD-10-CM | POA: Diagnosis not present

## 2023-10-10 DIAGNOSIS — D559 Anemia due to enzyme disorder, unspecified: Secondary | ICD-10-CM | POA: Diagnosis not present

## 2023-10-15 DIAGNOSIS — Z6828 Body mass index (BMI) 28.0-28.9, adult: Secondary | ICD-10-CM | POA: Diagnosis not present

## 2023-10-15 DIAGNOSIS — E039 Hypothyroidism, unspecified: Secondary | ICD-10-CM | POA: Diagnosis not present

## 2023-10-15 DIAGNOSIS — Z23 Encounter for immunization: Secondary | ICD-10-CM | POA: Diagnosis not present

## 2023-10-15 DIAGNOSIS — Z0001 Encounter for general adult medical examination with abnormal findings: Secondary | ICD-10-CM | POA: Diagnosis not present

## 2023-10-15 DIAGNOSIS — E785 Hyperlipidemia, unspecified: Secondary | ICD-10-CM | POA: Diagnosis not present

## 2023-10-15 DIAGNOSIS — Z2911 Encounter for prophylactic immunotherapy for respiratory syncytial virus (RSV): Secondary | ICD-10-CM | POA: Diagnosis not present

## 2023-10-15 DIAGNOSIS — I10 Essential (primary) hypertension: Secondary | ICD-10-CM | POA: Diagnosis not present

## 2023-10-15 DIAGNOSIS — N400 Enlarged prostate without lower urinary tract symptoms: Secondary | ICD-10-CM | POA: Diagnosis not present

## 2023-11-05 ENCOUNTER — Telehealth: Payer: Self-pay

## 2023-11-05 DIAGNOSIS — N411 Chronic prostatitis: Secondary | ICD-10-CM

## 2023-11-05 NOTE — Telephone Encounter (Signed)
 Pain scale 9/10 since last time he was seen pt states the abx never worked and wanted a sooner appt pt advised we had no sooner appt but he should contact his PCP until MD returns on 08/20 pt voiced his understanding stating he's wait until his scheduled appt

## 2023-11-17 MED ORDER — CEFPODOXIME PROXETIL 200 MG PO TABS: 200.0000 mg | ORAL_TABLET | Freq: Two times a day (BID) | ORAL | 0 refills | Status: DC

## 2023-11-17 NOTE — Addendum Note (Signed)
 Addended by: SAMMIE EXIE HERO on: 11/17/2023 05:04 PM   Modules accepted: Orders

## 2023-11-17 NOTE — Telephone Encounter (Signed)
 Lvm for pt to c/b to confirm pharmacy for new Rx of abx

## 2023-11-18 ENCOUNTER — Telehealth: Payer: Self-pay

## 2023-11-18 DIAGNOSIS — N411 Chronic prostatitis: Secondary | ICD-10-CM

## 2023-11-18 MED ORDER — CEFPODOXIME PROXETIL 200 MG PO TABS: 200.0000 mg | ORAL_TABLET | Freq: Two times a day (BID) | ORAL | 0 refills | Status: DC

## 2023-11-18 NOTE — Telephone Encounter (Signed)
 Pt walked in clinic today to verify the message that was left on his phone on yesterday pt stated he received the Rx notification from [facility] but could not afford it pt believes if we sent Rx to new pharmacy he wouldn't be charged for it pt advised we would sen the Rx to the pharmacy of his choice which was [facility] on file

## 2024-01-09 ENCOUNTER — Other Ambulatory Visit: Payer: PPO

## 2024-01-09 ENCOUNTER — Other Ambulatory Visit: Payer: Self-pay

## 2024-01-09 DIAGNOSIS — R972 Elevated prostate specific antigen [PSA]: Secondary | ICD-10-CM

## 2024-01-10 LAB — PSA: Prostate Specific Ag, Serum: 3.1 ng/mL (ref 0.0–4.0)

## 2024-01-13 ENCOUNTER — Ambulatory Visit: Payer: Self-pay | Admitting: Urology

## 2024-01-15 DIAGNOSIS — E1169 Type 2 diabetes mellitus with other specified complication: Secondary | ICD-10-CM | POA: Diagnosis not present

## 2024-01-15 DIAGNOSIS — E785 Hyperlipidemia, unspecified: Secondary | ICD-10-CM | POA: Diagnosis not present

## 2024-01-16 ENCOUNTER — Ambulatory Visit: Payer: PPO | Admitting: Urology

## 2024-01-16 VITALS — BP 121/68 | HR 65

## 2024-01-16 DIAGNOSIS — N411 Chronic prostatitis: Secondary | ICD-10-CM | POA: Diagnosis not present

## 2024-01-16 DIAGNOSIS — N401 Enlarged prostate with lower urinary tract symptoms: Secondary | ICD-10-CM

## 2024-01-16 DIAGNOSIS — R351 Nocturia: Secondary | ICD-10-CM

## 2024-01-16 DIAGNOSIS — R972 Elevated prostate specific antigen [PSA]: Secondary | ICD-10-CM

## 2024-01-16 LAB — URINALYSIS, ROUTINE W REFLEX MICROSCOPIC
Bilirubin, UA: NEGATIVE
Glucose, UA: NEGATIVE
Ketones, UA: NEGATIVE
Leukocytes,UA: NEGATIVE
Nitrite, UA: NEGATIVE
Protein,UA: NEGATIVE
RBC, UA: NEGATIVE
Specific Gravity, UA: 1.02 (ref 1.005–1.030)
Urobilinogen, Ur: 0.2 mg/dL (ref 0.2–1.0)
pH, UA: 6 (ref 5.0–7.5)

## 2024-01-16 MED ORDER — ALFUZOSIN HCL ER 10 MG PO TB24: 10.0000 mg | ORAL_TABLET | Freq: Two times a day (BID) | ORAL | 3 refills | Status: AC

## 2024-01-16 MED ORDER — CEFPODOXIME PROXETIL 200 MG PO TABS: 200.0000 mg | ORAL_TABLET | Freq: Two times a day (BID) | ORAL | 0 refills | Status: AC

## 2024-01-16 NOTE — Progress Notes (Signed)
 01/16/2024 9:48 AM   Lucas Leach 05-27-1950 [id number]  Referring provider: Marvine Rush, MD [address] [place],  [place] [address]  Followup elevated PSA   HPI: [name] is a 73yo here for followup for elevated PSA, Chronic prostatitis and BPH. PSA decreased to 3.1 from 3.6. IPSS 7 QOL 2 on uroxatral  10mg  BID. Uirne stream strong. No straining to urinate. Nocturia 1x. No dysuria.    PMH: Past Medical History:  Diagnosis Date   GERD (gastroesophageal reflux disease)    Hypothyroidism    Lumbar vertebral fracture (HCC)    L3    Surgical History: Past Surgical History:  Procedure Laterality Date   ADENOIDECTOMY     COLONOSCOPY N/A 09/22/2012   Procedure: COLONOSCOPY;  Surgeon: Lucas DELENA Budge, MD;  [facility];  Service: Gastroenterology;  Laterality: N/A;   ESOPHAGOGASTRODUODENOSCOPY (EGD) WITH ESOPHAGEAL DILATION N/A 09/22/2012   Procedure: ESOPHAGOGASTRODUODENOSCOPY (EGD) WITH POSSIBLE ESOPHAGEAL DILATION;  Surgeon: Lucas DELENA Budge, MD;  [facility];  Service: Gastroenterology;  Laterality: N/A;   THUMB AMPUTATION Left 2018   TONSILLECTOMY      Home Medications:  Allergies as of 01/16/2024       Reactions   Latex Rash        Medication List        Accurate as of January 16, 2024  9:48 AM. If you have any questions, ask your nurse or doctor.          alfuzosin  10 MG 24 hr tablet Commonly known as: UROXATRAL  Take 1 tablet (10 mg total) by mouth in the morning and at bedtime.   aspirin EC 81 MG tablet Take 81 mg by mouth daily.   cefpodoxime  200 MG tablet Commonly known as: VANTIN  Take 1 tablet (200 mg total) by mouth 2 (two) times daily.   clotrimazole -betamethasone  cream Commonly known as: Lotrisone  Apply 1 Application topically 2 (two) times daily.   famotidine  20 MG tablet Commonly known as: Pepcid  One after supper   glucosamine-chondroitin 500-400 MG tablet Take 1 tablet by mouth 3 (three) times daily.    levothyroxine 100 MCG tablet Commonly known as: SYNTHROID Take 100 mcg by mouth daily before breakfast.   metoprolol  tartrate 25 MG tablet Commonly known as: LOPRESSOR  Take 1 tablet (25 mg total) by mouth 2 (two) times daily.   multivitamin with minerals Tabs tablet Take 1 tablet by mouth every other day.   nystatin -triamcinolone  ointment Commonly known as: MYCOLOG Apply 1 Application topically 2 (two) times daily.   omeprazole  40 MG capsule Commonly known as: PRILOSEC Take by mouth.   rosuvastatin 10 MG tablet Commonly known as: CRESTOR Take 10 mg by mouth at bedtime.   Spiriva Respimat 2.5 MCG/ACT Aers Generic drug: Tiotropium Bromide Inhale 1 puff into the lungs daily.   traZODone 100 MG tablet Commonly known as: DESYREL Take 100 mg by mouth at bedtime.   valACYclovir  1000 MG tablet Commonly known as: Valtrex  Take 1 tablet (1,000 mg total) by mouth 2 (two) times daily.        Allergies:  Allergies  Allergen Reactions   Latex Rash    Family History: Family History  Problem Relation Age of Onset   Diabetes Mellitus II Mother    Cerebral aneurysm Father     Social History:  reports that he has quit smoking. His smoking use included cigarettes. He has never used smokeless tobacco. He reports current alcohol  use. He reports that he does not use  drugs.  ROS: All other review of systems were reviewed and are negative except what is noted above in HPI  Physical Exam: BP 121/68   Pulse 65   Constitutional:  Alert and oriented, No acute distress. HEENT: [place] AT, moist mucus membranes.  Trachea midline, no masses. Cardiovascular: No clubbing, cyanosis, or edema. Respiratory: Normal respiratory effort, no increased work of breathing. GI: Abdomen is soft, nontender, nondistended, no abdominal masses GU: No CVA tenderness.  Lymph: No cervical or inguinal lymphadenopathy. Skin: No rashes, bruises or suspicious lesions. Neurologic: Grossly intact, no focal  deficits, moving all 4 extremities. Psychiatric: Normal mood and affect.  Laboratory Data: Lab Results  Component Value Date   WBC 5.9 02/11/2023   HGB 15.1 02/11/2023   HCT 44.8 02/11/2023   MCV 92 02/11/2023   PLT 202 02/11/2023    Lab Results  Component Value Date   CREATININE 0.96 02/11/2023    No results found for: PSA  No results found for: TESTOSTERONE  No results found for: HGBA1C  Urinalysis    Component Value Date/Time   APPEARANCEUR Clear 10/08/2023 1044   GLUCOSEU Negative 10/08/2023 1044   BILIRUBINUR Negative 10/08/2023 1044   PROTEINUR Negative 10/08/2023 1044   NITRITE Negative 10/08/2023 1044   LEUKOCYTESUR Negative 10/08/2023 1044    Lab Results  Component Value Date   LABMICR Comment 10/08/2023   WBCUA 0-5 01/21/2022   LABEPIT 0-10 01/21/2022   BACTERIA None seen 01/21/2022    Pertinent Imaging:  No results found for this or any previous visit.  No results found for this or any previous visit.  No results found for this or any previous visit.  No results found for this or any previous visit.  No results found for this or any previous visit.  No results found for this or any previous visit.  No results found for this or any previous visit.  No results found for this or any previous visit.   Assessment & Plan:    1. Chronic prostatitis without hematuria (Primary) Uroxatral  10mg  at bedtime Self start vantin  for flares - Urinalysis, Routine w reflex microscopic  2. Elevated PSA Followup 1 year with a PSA  3. Benign localized prostatic hyperplasia with lower urinary tract symptoms (LUTS) Uroxatral  10mg  qhs  4. Nocturia Uroxatral  10mg  qhs   No follow-ups on file.  Lucas Clara, MD  [facility] [place]

## 2024-01-20 ENCOUNTER — Encounter: Payer: Self-pay | Admitting: Urology

## 2024-01-20 NOTE — Patient Instructions (Signed)

## 2024-01-22 DIAGNOSIS — J019 Acute sinusitis, unspecified: Secondary | ICD-10-CM | POA: Diagnosis not present

## 2024-01-22 DIAGNOSIS — I10 Essential (primary) hypertension: Secondary | ICD-10-CM | POA: Diagnosis not present

## 2024-01-22 DIAGNOSIS — N411 Chronic prostatitis: Secondary | ICD-10-CM | POA: Diagnosis not present

## 2024-01-22 DIAGNOSIS — E1169 Type 2 diabetes mellitus with other specified complication: Secondary | ICD-10-CM | POA: Diagnosis not present

## 2024-01-22 DIAGNOSIS — N400 Enlarged prostate without lower urinary tract symptoms: Secondary | ICD-10-CM | POA: Diagnosis not present

## 2024-01-22 DIAGNOSIS — Z23 Encounter for immunization: Secondary | ICD-10-CM | POA: Diagnosis not present

## 2024-01-22 DIAGNOSIS — Z6827 Body mass index (BMI) 27.0-27.9, adult: Secondary | ICD-10-CM | POA: Diagnosis not present

## 2024-02-24 DIAGNOSIS — H04123 Dry eye syndrome of bilateral lacrimal glands: Secondary | ICD-10-CM | POA: Diagnosis not present

## 2024-03-31 NOTE — Progress Notes (Signed)
 Lucas Leach                                          MRN: [id number]   03/31/2024   The [facility] Quality Team Specialist reviewed this patient medical record for the purposes of chart review for care gap closure. The following were reviewed: chart review for care gap closure-kidney health evaluation for diabetes:eGFR  and uACR.    [facility] Quality Team

## 2024-04-08 ENCOUNTER — Other Ambulatory Visit: Payer: Self-pay | Admitting: Internal Medicine

## 2025-01-04 ENCOUNTER — Other Ambulatory Visit

## 2025-01-12 ENCOUNTER — Ambulatory Visit: Admitting: Urology
